# Patient Record
Sex: Male | Born: 1960
Health system: Southern US, Community
[De-identification: ages and names within clinical notes are randomized; demographics above are authoritative.]

## PROBLEM LIST (undated history)

## (undated) DIAGNOSIS — M549 Dorsalgia, unspecified: Secondary | ICD-10-CM

## (undated) DIAGNOSIS — G8929 Other chronic pain: Secondary | ICD-10-CM

## (undated) DIAGNOSIS — F419 Anxiety disorder, unspecified: Secondary | ICD-10-CM

## (undated) DIAGNOSIS — M1990 Unspecified osteoarthritis, unspecified site: Secondary | ICD-10-CM

## (undated) DIAGNOSIS — C61 Malignant neoplasm of prostate: Secondary | ICD-10-CM

## (undated) DIAGNOSIS — R51 Headache: Secondary | ICD-10-CM

## (undated) DIAGNOSIS — I10 Essential (primary) hypertension: Secondary | ICD-10-CM

## (undated) DIAGNOSIS — R519 Headache, unspecified: Secondary | ICD-10-CM

## (undated) HISTORY — PX: COLONOSCOPY: SHX174

## (undated) HISTORY — PX: PROSTATECTOMY: SHX69

---

## 2008-06-12 ENCOUNTER — Emergency Department (HOSPITAL_COMMUNITY): Admission: EM | Admit: 2008-06-12 | Discharge: 2008-06-13 | Payer: Self-pay | Admitting: Emergency Medicine

## 2008-06-13 ENCOUNTER — Ambulatory Visit: Payer: Self-pay | Admitting: Family Medicine

## 2008-07-04 DIAGNOSIS — R55 Syncope and collapse: Secondary | ICD-10-CM | POA: Insufficient documentation

## 2008-07-08 ENCOUNTER — Encounter: Payer: Self-pay | Admitting: Cardiology

## 2008-07-08 ENCOUNTER — Ambulatory Visit: Payer: Self-pay | Admitting: Cardiology

## 2008-07-21 ENCOUNTER — Encounter: Payer: Self-pay | Admitting: Cardiology

## 2008-07-21 ENCOUNTER — Ambulatory Visit: Payer: Self-pay

## 2008-07-21 ENCOUNTER — Ambulatory Visit: Payer: Self-pay | Admitting: Cardiology

## 2008-09-03 ENCOUNTER — Ambulatory Visit: Payer: Self-pay | Admitting: Cardiology

## 2009-08-07 ENCOUNTER — Ambulatory Visit: Payer: Self-pay | Admitting: Family Medicine

## 2009-09-03 LAB — HM COLONOSCOPY

## 2009-09-14 ENCOUNTER — Ambulatory Visit: Payer: Self-pay | Admitting: Family Medicine

## 2009-12-10 ENCOUNTER — Ambulatory Visit: Payer: Self-pay | Admitting: Family Medicine

## 2010-07-08 LAB — POCT I-STAT, CHEM 8
BUN: 19 mg/dL (ref 6–23)
Chloride: 105 mEq/L (ref 96–112)
Creatinine, Ser: 1 mg/dL (ref 0.4–1.5)
Glucose, Bld: 141 mg/dL — ABNORMAL HIGH (ref 70–99)
Hemoglobin: 12.9 g/dL — ABNORMAL LOW (ref 13.0–17.0)
Potassium: 3.5 mEq/L (ref 3.5–5.1)
Sodium: 138 mEq/L (ref 135–145)

## 2010-07-08 LAB — CBC
Hemoglobin: 13.3 g/dL (ref 13.0–17.0)
MCHC: 35.6 g/dL (ref 30.0–36.0)
RBC: 4.3 MIL/uL (ref 4.22–5.81)
WBC: 8.4 10*3/uL (ref 4.0–10.5)

## 2010-07-08 LAB — ETHANOL: Alcohol, Ethyl (B): 24 mg/dL — ABNORMAL HIGH (ref 0–10)

## 2014-06-10 ENCOUNTER — Ambulatory Visit (INDEPENDENT_AMBULATORY_CARE_PROVIDER_SITE_OTHER): Payer: BLUE CROSS/BLUE SHIELD | Admitting: Family Medicine

## 2014-06-10 ENCOUNTER — Encounter: Payer: Self-pay | Admitting: Family Medicine

## 2014-06-10 VITALS — BP 126/80 | HR 86 | Wt 150.0 lb

## 2014-06-10 DIAGNOSIS — M7552 Bursitis of left shoulder: Secondary | ICD-10-CM | POA: Diagnosis not present

## 2014-06-10 NOTE — Progress Notes (Signed)
   Subjective:    Patient ID: Jeff Alexander, male    DOB: December 08, 1960, 54 y.o.   MRN: [id number]  HPI He complains of a two-month history of intermittent left shoulder pain. He notes this especially with abduction and external rotation. No previous history of injury. He notes no pain at night. Popping, locking. He does complain of some left upper trapezius muscle pain. No numbness or tingling. He did try to Advil without much success.   Review of Systems     Objective:   Physical Exam Alert and in no distress. Small trigger point noted in the left mid trapezius. Full motion of the shoulder. No laxity. Negative sulcus sign. No tenderness to palpation. Supraspinatus testing was uncomfortable. Neer's and Hawkins test was negative.       Assessment & Plan:  Shoulder bursitis, left Assistance are most consistent with a bursitis. I discussed this with him and decided to inject. 40 mg of Kenalog and 3 mL of Xylocaine was injected in the subacromial bursa without difficulty after prep with Betadine. He is to let me know how he responds. Recommand heat and stretching for the upper back discomfort.

## 2015-03-12 ENCOUNTER — Encounter: Payer: Self-pay | Admitting: Family Medicine

## 2015-03-12 ENCOUNTER — Other Ambulatory Visit: Payer: Self-pay | Admitting: Family Medicine

## 2015-03-12 ENCOUNTER — Ambulatory Visit (INDEPENDENT_AMBULATORY_CARE_PROVIDER_SITE_OTHER): Payer: BLUE CROSS/BLUE SHIELD | Admitting: Family Medicine

## 2015-03-12 VITALS — BP 128/82 | HR 60 | Wt 152.4 lb

## 2015-03-12 DIAGNOSIS — N63 Unspecified lump in unspecified breast: Secondary | ICD-10-CM

## 2015-03-12 DIAGNOSIS — N50819 Testicular pain, unspecified: Secondary | ICD-10-CM

## 2015-03-12 NOTE — Progress Notes (Signed)
   Subjective:    Patient ID: Jeff Alexander, male    DOB: [birth year], 54 y.o.   MRN: [id number]  HPI For the last 2 weeks he has noted intermittent right testicular discomfort. He notes this especially when he is driving but sitting in other positions, he has no difficulty. He has a previous history of intermittent back pain but none recently. No discharge, frequency, dysuria. His last sexual activity is unknown. He also complains of a 2 day history of feeling a lesion in his right breast. No discharge and some itching but no pain.  medications were reviewed. He is not using any street drugs.   Review of Systems     Objective:   Physical Exam Exam of the right breast does show a 1 cm round smooth nontender nodule at the 2:00 position. Testicular exam shows a left testes to be normal. The right testes has a less than 0.5 cm lesion on the posterior aspect of the testing in the area of them vas deferens. Abdominal exam shows no hepatosplenomegaly , masses or tenderness       Assessment & Plan:  Breast mass in male - Plan: MM Digital Diagnostic Unilat L  Pain of testes  I will also order an ultrasound on his testes to evaluate that.

## 2015-03-17 ENCOUNTER — Ambulatory Visit
Admission: RE | Admit: 2015-03-17 | Discharge: 2015-03-17 | Disposition: A | Discharge status: Home / Self Care | Payer: BLUE CROSS/BLUE SHIELD | Source: Ambulatory Visit | Attending: Family Medicine | Admitting: Family Medicine

## 2015-03-17 DIAGNOSIS — N50819 Testicular pain, unspecified: Secondary | ICD-10-CM

## 2015-03-18 ENCOUNTER — Ambulatory Visit
Admission: RE | Admit: 2015-03-18 | Discharge: 2015-03-18 | Disposition: A | Discharge status: Home / Self Care | Payer: BLUE CROSS/BLUE SHIELD | Source: Ambulatory Visit | Attending: Family Medicine | Admitting: Family Medicine

## 2015-03-18 DIAGNOSIS — N63 Unspecified lump in unspecified breast: Secondary | ICD-10-CM

## 2015-06-07 ENCOUNTER — Emergency Department (HOSPITAL_COMMUNITY)
Admission: EM | Admit: 2015-06-07 | Discharge: 2015-06-07 | Disposition: A | Discharge status: Home / Self Care | Payer: BLUE CROSS/BLUE SHIELD | Attending: Emergency Medicine | Admitting: Emergency Medicine

## 2015-06-07 ENCOUNTER — Emergency Department (HOSPITAL_COMMUNITY): Payer: BLUE CROSS/BLUE SHIELD

## 2015-06-07 ENCOUNTER — Encounter (HOSPITAL_COMMUNITY): Payer: Self-pay | Admitting: Emergency Medicine

## 2015-06-07 DIAGNOSIS — K5732 Diverticulitis of large intestine without perforation or abscess without bleeding: Secondary | ICD-10-CM | POA: Insufficient documentation

## 2015-06-07 DIAGNOSIS — F419 Anxiety disorder, unspecified: Secondary | ICD-10-CM | POA: Insufficient documentation

## 2015-06-07 DIAGNOSIS — R1032 Left lower quadrant pain: Secondary | ICD-10-CM | POA: Diagnosis present

## 2015-06-07 LAB — CBC WITH DIFFERENTIAL/PLATELET
Basophils Absolute: 0 10*3/uL (ref 0.0–0.1)
Basophils Relative: 0 %
Eosinophils Absolute: 0 10*3/uL (ref 0.0–0.7)
Eosinophils Relative: 0 %
HCT: 41.5 % (ref 39.0–52.0)
Hemoglobin: 14.8 g/dL (ref 13.0–17.0)
Lymphocytes Relative: 9 %
Lymphs Abs: 1.4 10*3/uL (ref 0.7–4.0)
MCH: 29.9 pg (ref 26.0–34.0)
MCHC: 35.7 g/dL (ref 30.0–36.0)
MCV: 83.8 fL (ref 78.0–100.0)
Monocytes Absolute: 0.5 10*3/uL (ref 0.1–1.0)
Monocytes Relative: 3 %
Neutro Abs: 14.4 10*3/uL — ABNORMAL HIGH (ref 1.7–7.7)
Neutrophils Relative %: 88 %
Platelets: 240 10*3/uL (ref 150–400)
RBC: 4.95 MIL/uL (ref 4.22–5.81)
RDW: 12.8 % (ref 11.5–15.5)
WBC: 16.4 10*3/uL — ABNORMAL HIGH (ref 4.0–10.5)

## 2015-06-07 LAB — COMPREHENSIVE METABOLIC PANEL
ALT: 18 U/L (ref 17–63)
AST: 16 U/L (ref 15–41)
Albumin: 4.6 g/dL (ref 3.5–5.0)
Alkaline Phosphatase: 67 U/L (ref 38–126)
Anion gap: 9 (ref 5–15)
BUN: 12 mg/dL (ref 6–20)
CO2: 26 mmol/L (ref 22–32)
Calcium: 9.6 mg/dL (ref 8.9–10.3)
Chloride: 102 mmol/L (ref 101–111)
Creatinine, Ser: 1.03 mg/dL (ref 0.61–1.24)
GFR calc Af Amer: >60 mL/min (ref >60)
GFR calc non Af Amer: >60 mL/min (ref >60)
Glucose, Bld: 122 mg/dL — ABNORMAL HIGH (ref 65–99)
Potassium: 4.5 mmol/L (ref 3.5–5.1)
Sodium: 137 mmol/L (ref 135–145)
Total Bilirubin: 1 mg/dL (ref 0.3–1.2)
Total Protein: 7.4 g/dL (ref 6.5–8.1)

## 2015-06-07 LAB — URINALYSIS, ROUTINE W REFLEX MICROSCOPIC
Bilirubin Urine: NEGATIVE
Glucose, UA: NEGATIVE mg/dL
HGB URINE DIPSTICK: NEGATIVE
Ketones, ur: NEGATIVE mg/dL
Leukocytes, UA: NEGATIVE
NITRITE: NEGATIVE
PH: 6 (ref 5.0–8.0)
Protein, ur: NEGATIVE mg/dL
SPECIFIC GRAVITY, URINE: 1.003 — AB (ref 1.005–1.030)

## 2015-06-07 MED ORDER — ONDANSETRON HCL 4 MG/2ML IJ SOLN
4.0000 mg | Freq: Once | INTRAMUSCULAR | Status: AC
  Administered 2015-06-07: 4 mg via INTRAVENOUS
  Filled 2015-06-07: qty 2

## 2015-06-07 MED ORDER — IOHEXOL 300 MG/ML  SOLN
100.0000 mL | Freq: Once | INTRAMUSCULAR | Status: AC | PRN
  Administered 2015-06-07: 100 mL via INTRAVENOUS

## 2015-06-07 MED ORDER — METRONIDAZOLE 500 MG PO TABS: 500.0000 mg | ORAL_TABLET | Freq: Two times a day (BID) | ORAL | Status: DC

## 2015-06-07 MED ORDER — CIPROFLOXACIN HCL 500 MG PO TABS: 500.0000 mg | ORAL_TABLET | Freq: Two times a day (BID) | ORAL | Status: DC

## 2015-06-07 MED ORDER — HYDROCODONE-ACETAMINOPHEN 5-325 MG PO TABS: 1.0000 | ORAL_TABLET | Freq: Four times a day (QID) | ORAL | Status: DC | PRN

## 2015-06-07 MED ORDER — SODIUM CHLORIDE 0.9 % IV BOLUS (SEPSIS)
1000.0000 mL | Freq: Once | INTRAVENOUS | Status: AC
Start: 2015-06-07 — End: 2015-06-07
  Administered 2015-06-07: 1000 mL via INTRAVENOUS

## 2015-06-07 MED ORDER — IOHEXOL 300 MG/ML  SOLN
25.0000 mL | Freq: Once | INTRAMUSCULAR | Status: AC | PRN
  Administered 2015-06-07: 25 mL via ORAL

## 2015-06-07 NOTE — Discharge Instructions (Signed)
Diverticulitis Diverticulitis is inflammation or infection of small pouches in your colon that form when you have a condition called diverticulosis. The pouches in your colon are called diverticula. Your colon, or large intestine, is where water is absorbed and stool is formed. Complications of diverticulitis can include:  Bleeding.  Severe infection.  Severe pain.  Perforation of your colon.  Obstruction of your colon. CAUSES  Diverticulitis is caused by bacteria. Diverticulitis happens when stool becomes trapped in diverticula. This allows bacteria to grow in the diverticula, which can lead to inflammation and infection. RISK FACTORS People with diverticulosis are at risk for diverticulitis. Eating a diet that does not include enough fiber from fruits and vegetables may make diverticulitis more likely to develop. SYMPTOMS  Symptoms of diverticulitis may include:  Abdominal pain and tenderness. The pain is normally located on the left side of the abdomen, but may occur in other areas.  Fever and chills.  Bloating.  Cramping.  Nausea.  Vomiting.  Constipation.  Diarrhea.  Blood in your stool. DIAGNOSIS  Your health care provider will ask you about your medical history and do a physical exam. You may need to have tests done because many medical conditions can cause the same symptoms as diverticulitis. Tests may include:  Blood tests.  Urine tests.  Imaging tests of the abdomen, including X-rays and CT scans. When your condition is under control, your health care provider may recommend that you have a colonoscopy. A colonoscopy can show how severe your diverticula are and whether something else is causing your symptoms. TREATMENT  Most cases of diverticulitis are mild and can be treated at home. Treatment may include:  Taking over-the-counter pain medicines.  Following a clear liquid diet.  Taking antibiotic medicines by mouth for 7-10 days. More severe cases may  be treated at a hospital. Treatment may include:  Not eating or drinking.  Taking prescription pain medicine.  Receiving antibiotic medicines through an IV tube.  Receiving fluids and nutrition through an IV tube.  Surgery. HOME CARE INSTRUCTIONS   Follow your health care provider's instructions carefully.  Follow a full liquid diet or other diet as directed by your health care provider. After your symptoms improve, your health care provider may tell you to change your diet. He or she may recommend you eat a high-fiber diet. Fruits and vegetables are good sources of fiber. Fiber makes it easier to pass stool.  Take fiber supplements or probiotics as directed by your health care provider.  Only take medicines as directed by your health care provider.  Keep all your follow-up appointments. SEEK MEDICAL CARE IF:   Your pain does not improve.  You have a hard time eating food.  Your bowel movements do not return to normal. SEEK IMMEDIATE MEDICAL CARE IF:   Your pain becomes worse.  Your symptoms do not get better.  Your symptoms suddenly get worse.  You have a fever.  You have repeated vomiting.  You have bloody or black, tarry stools. MAKE SURE YOU:   Understand these instructions.  Will watch your condition.  Will get help right away if you are not doing well or get worse.   This information is not intended to replace advice given to you by your health care provider. Make sure you discuss any questions you have with your health care provider.   Document Released: 12/22/2004 Document Revised: 03/19/2013 Document Reviewed: 02/06/2013 Elsevier Interactive Patient Education 2016 Elsevier Inc.  High-Fiber Diet Fiber, also called dietary fiber, is  a type of carbohydrate found in fruits, vegetables, whole grains, and beans. A high-fiber diet can have many health benefits. Your health care provider may recommend a high-fiber diet to help:  Prevent constipation. Fiber  can make your bowel movements more regular.  Lower your cholesterol.  Relieve hemorrhoids, uncomplicated diverticulosis, or irritable bowel syndrome.  Prevent overeating as part of a weight-loss plan.  Prevent heart disease, type 2 diabetes, and certain cancers. WHAT IS MY PLAN? The recommended daily intake of fiber includes:  38 grams for men under age 55.  48 grams for men over age 73.  36 grams for women under age 44.  84 grams for women over age 20. You can get the recommended daily intake of dietary fiber by eating a variety of fruits, vegetables, grains, and beans. Your health care provider may also recommend a fiber supplement if it is not possible to get enough fiber through your diet. WHAT DO I NEED TO KNOW ABOUT A HIGH-FIBER DIET?  Fiber supplements have not been widely studied for their effectiveness, so it is better to get fiber through food sources.  Always check the fiber content on thenutrition facts label of any prepackaged food. Look for foods that contain at least 5 grams of fiber per serving.  Ask your dietitian if you have questions about specific foods that are related to your condition, especially if those foods are not listed in the following section.  Increase your daily fiber consumption gradually. Increasing your intake of dietary fiber too quickly may cause bloating, cramping, or gas.  Drink plenty of water. Water helps you to digest fiber. WHAT FOODS CAN I EAT? Grains Whole-grain breads. Multigrain cereal. Oats and oatmeal. Brown rice. Barley. Bulgur wheat. [place]. Bran muffins. Popcorn. Rye wafer crackers. Vegetables Sweet potatoes. Spinach. Kale. Artichokes. Cabbage. Broccoli. Green peas. Carrots. Squash. Fruits Berries. Pears. Apples. Oranges. Avocados. Prunes and raisins. Dried figs. Meats and Other Protein Sources [place], kidney, pinto, and soy beans. Split peas. Lentils. Nuts and seeds. Dairy Fiber-fortified yogurt. Beverages Fiber-fortified  soy milk. Fiber-fortified orange juice. Other Fiber bars. The items listed above may not be a complete list of recommended foods or beverages. Contact your dietitian for more options. WHAT FOODS ARE NOT RECOMMENDED? Grains White bread. Pasta made with refined flour. White rice. Vegetables Fried potatoes. Canned vegetables. Well-cooked vegetables.  Fruits Fruit juice. Cooked, strained fruit. Meats and Other Protein Sources Fatty cuts of meat. Fried Sales executive or fried fish. Dairy Milk. Yogurt. Cream cheese. Sour cream. Beverages Soft drinks. Other Cakes and pastries. Butter and oils. The items listed above may not be a complete list of foods and beverages to avoid. Contact your dietitian for more information. WHAT ARE SOME TIPS FOR INCLUDING HIGH-FIBER FOODS IN MY DIET?  Eat a wide variety of high-fiber foods.  Make sure that half of all grains consumed each day are whole grains.  Replace breads and cereals made from refined flour or white flour with whole-grain breads and cereals.  Replace white rice with brown rice, bulgur wheat, or millet.  Start the day with a breakfast that is high in fiber, such as a cereal that contains at least 5 grams of fiber per serving.  Use beans in place of meat in soups, salads, or pasta.  Eat high-fiber snacks, such as berries, raw vegetables, nuts, or popcorn.   This information is not intended to replace advice given to you by your health care provider. Make sure you discuss any questions you have with your health  care provider.   Document Released: 03/14/2005 Document Revised: 04/04/2014 Document Reviewed: 08/27/2013 Elsevier Interactive Patient Education [employer].

## 2015-06-07 NOTE — ED Provider Notes (Signed)
CSN: [id number]     Arrival date & time 06/07/15  1647 History   First MD Initiated Contact with Patient 06/07/15 1652     Chief Complaint  Patient presents with  . Abdominal Pain    Jeff Alexander is a 55 y.o. male who presents to the ED complaining of LLQ abdominal pain starting today with associated loose stool today. The patient currently complains of 5 out of 10 left lower quadrant abdominal pain that is worse with palpation. He reports one loose stool today. He denies any nausea or vomiting. He denies any hematemesis or hematochezia. He denies previous abdominal surgeries. He reports decreased appetite today. He denies fevers, nausea, vomiting, chest pain, shortness of breath, significant, urinary symptoms, masses, lumps, or hernias.  HPI  History reviewed. No pertinent past medical history. History reviewed. No pertinent past surgical history. No family history on file. Social History  Substance Use Topics  . Smoking status: Never Smoker   . Smokeless tobacco: Never Used  . Alcohol Use: No    Review of Systems  Constitutional: Negative for fever and chills.  HENT: Negative for congestion and sore throat.   Eyes: Negative for visual disturbance.  Respiratory: Negative for cough and shortness of breath.   Cardiovascular: Negative for chest pain.  Gastrointestinal: Positive for abdominal pain and diarrhea. Negative for nausea, vomiting and blood in stool.  Genitourinary: Negative for dysuria, urgency, hematuria and difficulty urinating.  Musculoskeletal: Negative for back pain and neck pain.  Skin: Negative for rash.  Neurological: Negative for syncope and headaches.      Allergies  Codeine  Home Medications   Prior to Admission medications   Not on File   BP 159/103 mmHg  Pulse 104  Temp(Src) 98.3 F (36.8 C) (Oral)  Resp 16  SpO2 98% Physical Exam  Constitutional: He appears well-developed and well-nourished. No distress.  Nontoxic appearing.  HENT:  Head:  Normocephalic and atraumatic.  Mouth/Throat: Oropharynx is clear and moist.  Eyes: Conjunctivae are normal. Pupils are equal, round, and reactive to light. Right eye exhibits no discharge. Left eye exhibits no discharge.  Neck: Neck supple.  Cardiovascular: Normal rate, regular rhythm, normal heart sounds and intact distal pulses.  Exam reveals no gallop and no friction rub.   No murmur heard. HR is 88.  Pulmonary/Chest: Effort normal and breath sounds normal. No respiratory distress. He has no wheezes. He has no rales.  Abdominal: Soft. Bowel sounds are normal. He exhibits no distension and no mass. There is tenderness. There is guarding. There is no rebound.  Abdomen is soft. Bowel sounds are present. Patient has moderate left lower quadrant tenderness to palpation with guarding. No right lower quadrant tenderness. No psoas or obturator sign. No CVA or flank tenderness. No masses or lesions palpated. No hernias palpated.  Musculoskeletal: He exhibits no edema.  Lymphadenopathy:    He has no cervical adenopathy.  Neurological: He is alert. Coordination normal.  Skin: Skin is warm and dry. No rash noted. He is not diaphoretic. No erythema. No pallor.  Psychiatric: His behavior is normal. His mood appears anxious.  Patient appears very anxious.   Nursing note and vitals reviewed.   ED Course  Procedures (including critical care time) Labs Review Labs Reviewed  COMPREHENSIVE METABOLIC PANEL - Abnormal; Notable for the following:    Glucose, Bld 122 (*)    All other components within normal limits  CBC WITH DIFFERENTIAL/PLATELET - Abnormal; Notable for the following:    WBC 16.4 (*)  Neutro Abs 14.4 (*)    All other components within normal limits  URINALYSIS, ROUTINE W REFLEX MICROSCOPIC (NOT AT [facility]) - Abnormal; Notable for the following:    Specific Gravity, Urine 1.003 (*)    All other components within normal limits    Imaging Review Ct Abdomen Pelvis W Contrast  06/07/2015   CLINICAL DATA:  Acute onset of left lower quadrant abdominal pain. Initial encounter. EXAM: CT ABDOMEN AND PELVIS WITH CONTRAST TECHNIQUE: Multidetector CT imaging of the abdomen and pelvis was performed using the standard protocol following bolus administration of intravenous contrast. CONTRAST:  140mL OMNIPAQUE IOHEXOL 300 MG/ML SOLN, 29mL OMNIPAQUE IOHEXOL 300 MG/ML SOLN COMPARISON:  Scrotal ultrasound performed 03/17/2015 FINDINGS: Minimal right basilar atelectasis is noted. The liver and spleen are unremarkable in appearance. The gallbladder is within normal limits. The pancreas and right adrenal gland are unremarkable. A 1.2 cm hypodensity within the left adrenal gland is not fully characterized on this study. The kidneys are unremarkable in appearance. There is no evidence of hydronephrosis. No renal or ureteral stones are seen. No perinephric stranding is appreciated. The small bowel is unremarkable in appearance. The stomach is within normal limits. No acute vascular abnormalities are seen. The appendix is normal in caliber, without evidence of appendicitis. The colon is largely decompressed. Scattered diverticulosis is noted along the descending and proximal sigmoid colon. Focal soft tissue inflammation is noted about the proximal sigmoid colon, with associated wall thickening and free fluid tracking inferiorly anterior to the left iliopsoas. The fluid is slightly complex, without definite evidence of abscess formation at this time. Findings are compatible with acute diverticulitis. No free air is seen. The bladder is moderately distended and grossly unremarkable. The prostate remains normal in size, with some degree of prominence of the vasculature about the left side of the prostate. Would correlate with PSA. No inguinal lymphadenopathy is seen. No acute osseous abnormalities are identified. IMPRESSION: 1. Acute diverticulitis at the proximal sigmoid colon, with focal soft tissue inflammation, wall  thickening and free fluid tracking inferiorly anterior to the left iliopsoas. The fluid is slightly complex, without definite evidence of abscess formation at this time. No free air seen. 2. Scattered diverticulosis along the descending and proximal sigmoid colon. 3. Prostate remains normal in size, though the vasculature about the left side of the prostate is prominent. Would correlate with PSA. 4. 1.2 cm left adrenal nodule is not fully characterized on this study. Would correlate with labs. Adrenal protocol MRI or CT could be considered for further evaluation. Electronically Signed   By: Garald Balding M.D.   On: 06/07/2015 18:49   I have personally reviewed and evaluated these images and lab results as part of my medical decision-making.   EKG Interpretation None     Filed Vitals:   06/07/15 1655  BP: 159/103  Pulse: 104  Temp: 98.3 F (36.8 C)  TempSrc: Oral  Resp: 16  SpO2: 98%    MDM   Meds given in ED:  Medications  sodium chloride 0.9 % bolus 1,000 mL (1,000 mLs Intravenous New Bag/Given 06/07/15 1814)  ondansetron (ZOFRAN) injection 4 mg (4 mg Intravenous Given 06/07/15 1814)  iohexol (OMNIPAQUE) 300 MG/ML solution 25 mL (25 mLs Oral Contrast Given 06/07/15 1835)  iohexol (OMNIPAQUE) 300 MG/ML solution 100 mL (100 mLs Intravenous Contrast Given 06/07/15 1835)    New Prescriptions   CIPROFLOXACIN (CIPRO) 500 MG TABLET    Take 1 tablet (500 mg total) by mouth 2 (two) times daily.  HYDROCODONE-ACETAMINOPHEN (NORCO) 5-325 MG TABLET    Take 1 tablet by mouth every 6 (six) hours as needed for moderate pain.   METRONIDAZOLE (FLAGYL) 500 MG TABLET    Take 1 tablet (500 mg total) by mouth 2 (two) times daily.    Final diagnoses:  Diverticulitis of large intestine without perforation or abscess without bleeding   This  is a 55 y.o. male who presents to the ED complaining of LLQ abdominal pain starting today with associated loose stool today. The patient currently complains of 5 out  of 10 left lower quadrant abdominal pain that is worse with palpation. He reports one loose stool today. He denies any nausea or vomiting. He denies any hematemesis or hematochezia. He denies previous abdominal surgeries. No history of diverticulitis. On exam the patient is afebrile nontoxic appearing. His abdomen is soft and has moderate left lower quadrant tenderness to palpation. CMP is unremarkable. CBC reveals a leukocytosis with a white count of 16,000. Urinalysis is unremarkable. CT abdomen and pelvis with contrast indicated acute diverticulitis at the proximal sigmoid colon, with focal soft tissue inflammation, wall thickening and free fluid. No definite evidence of abscess formation. No free air.  On reevaluation I discussed these findings with the patient. He is tolerating by mouth. He reports his pain is around a 2 out of 10 and he does not wish for any pain medicine. He has had no pain medication here. He did receive a liter fluid bolus. He does still appear slightly anxious. Will start the patient on Cipro and Flagyl and provide with Norco for breakthrough pain. I discussed the expected treatment and course of diverticulitis. I advised needs to follow-up with primary care and gastroenterology this week. I discussed strict and specific return precautions. I advised the patient to follow-up with their primary care provider this week. I advised the patient to return to the emergency department with new or worsening symptoms or new concerns. The patient verbalized understanding and agreement with plan.    This patient was discussed with Dr. Johnney Killian who agrees with assessment and plan.   Waynetta Pean, PA-C 06/07/15 2041  Charlesetta Shanks, MD 06/10/15 1535

## 2015-06-07 NOTE — ED Notes (Signed)
Per pt, states left lower abdominal pain since this am-PCP told him to come to ED for eval

## 2015-06-16 ENCOUNTER — Ambulatory Visit (INDEPENDENT_AMBULATORY_CARE_PROVIDER_SITE_OTHER): Payer: BLUE CROSS/BLUE SHIELD | Admitting: Family Medicine

## 2015-06-16 ENCOUNTER — Encounter: Payer: Self-pay | Admitting: Family Medicine

## 2015-06-16 VITALS — BP 128/82 | HR 94 | Resp 14 | Ht 67.0 in | Wt 151.8 lb

## 2015-06-16 DIAGNOSIS — K5732 Diverticulitis of large intestine without perforation or abscess without bleeding: Secondary | ICD-10-CM

## 2015-06-16 DIAGNOSIS — L739 Follicular disorder, unspecified: Secondary | ICD-10-CM

## 2015-06-16 MED ORDER — CIPROFLOXACIN HCL 500 MG PO TABS: 500.0000 mg | ORAL_TABLET | Freq: Two times a day (BID) | ORAL | Status: DC

## 2015-06-16 MED ORDER — HYDROCODONE-ACETAMINOPHEN 5-325 MG PO TABS: 1.0000 | ORAL_TABLET | Freq: Four times a day (QID) | ORAL | Status: DC | PRN

## 2015-06-16 MED ORDER — METRONIDAZOLE 500 MG PO TABS: 500.0000 mg | ORAL_TABLET | Freq: Three times a day (TID) | ORAL | Status: DC

## 2015-06-16 NOTE — Patient Instructions (Signed)
Use Hibiclens. If your symptoms worsen call me

## 2015-06-16 NOTE — Progress Notes (Signed)
   Subjective:    Patient ID: Jeff Alexander, male    DOB: 12/02/1960, 55 y.o.   MRN: [id number]  HPI He is here for follow-up visit after recent ER visit approximately 10 days ago for evaluation and treatment of diverticulitis. He was placed on Flagyl and Cipro as well as given codeine. He did improve within the last day or so he has noted a slight increase in the discomfort in the left lower quadrant. His bowel habits are normal. No fever, chills, nausea vomiting or diarrhea.He did have a colonoscopy in 2012. Within the last day or so his also developed a rash present mainly on his chest and to a lesser extent on his back. It is not pruritic.   Review of Systems     Objective:   Physical Exam Alert and in no distress. A maculopapular type rash is present on his anterior chest and to a lesser extent on his back. It is almost like an acne type rash. Abdominal exam shows decreased bowel sounds with slight lower abdominal tenderness but no rebound. The emergency room record lab and x-rays were reviewed.      Assessment & Plan:  Folliculitis  Diverticulitis of colon without hemorrhage - Plan: metroNIDAZOLE (FLAGYL) 500 MG tablet, ciprofloxacin (CIPRO) 500 MG tablet, HYDROcodone-acetaminophen (NORCO) 5-325 MG tablet when he used Hibiclens for the skin as I do not think this is related to his medications. I will continue him on Cipro and did get Flagyl at 500 3 times a day. If his symptoms worsen, I told him to call me this we might need to reevaluate him with a CT.

## 2015-06-16 NOTE — Addendum Note (Signed)
Addended by: Kyra Manges on: 06/16/2015 05:09 PM   Modules accepted: Orders

## 2015-06-17 ENCOUNTER — Ambulatory Visit
Admission: RE | Admit: 2015-06-17 | Discharge: 2015-06-17 | Disposition: A | Discharge status: Home / Self Care | Payer: No Typology Code available for payment source | Source: Ambulatory Visit | Attending: Family Medicine | Admitting: Family Medicine

## 2015-06-17 ENCOUNTER — Encounter: Payer: Self-pay | Admitting: Family Medicine

## 2015-06-17 ENCOUNTER — Telehealth: Payer: Self-pay | Admitting: Family Medicine

## 2015-06-17 DIAGNOSIS — K5792 Diverticulitis of intestine, part unspecified, without perforation or abscess without bleeding: Secondary | ICD-10-CM

## 2015-06-17 MED ORDER — IOPAMIDOL (ISOVUE-300) INJECTION 61%
100.0000 mL | Freq: Once | INTRAVENOUS | Status: AC | PRN
  Administered 2015-06-17: 100 mL via INTRAVENOUS

## 2015-06-17 NOTE — Telephone Encounter (Signed)
Pt called back and states that his pain is no better, when he lays down it gets worse, was told to call you back today if he was not feeling any better, he is just not feeling any better, pt can be reached at [phone] (M)

## 2015-06-17 NOTE — Telephone Encounter (Signed)
Set him up for CT scan of abdomen and pelvis with IV and oral contrast ASAP. Diagnosis is unresolved diverticulitis with slight worsening of symptoms

## 2015-06-17 NOTE — Telephone Encounter (Signed)
CT is scheduled for today.

## 2015-06-17 NOTE — Progress Notes (Signed)
   Subjective:    Patient ID: Jeff Alexander, male    DOB: 01-13-1961, 55 y.o.   MRN: [id number]  HPI    Review of Systems     Objective:   Physical Exam        Assessment & Plan:  His case was discussed with Dr. Ardis Hughs. I will order a CT of abdomen with IV and oral contrast and follow-up after the results are back.

## 2015-06-18 NOTE — Progress Notes (Signed)
   Subjective:    Patient ID: Jeff Alexander, male    DOB: [birth year], 55 y.o.   MRN: [id number]  HPI Comments: He is feeling better. I gave him information concerning the CT scan. He is to call me when he finishes this round of antibiotic and if he is not back to normal, I will give him more     Review of Systems     Objective:   Physical Exam        Assessment & Plan:

## 2017-07-11 ENCOUNTER — Ambulatory Visit: Payer: BLUE CROSS/BLUE SHIELD | Admitting: Family Medicine

## 2017-07-11 ENCOUNTER — Encounter: Payer: Self-pay | Admitting: Family Medicine

## 2017-07-11 VITALS — BP 138/86 | HR 77 | Temp 97.9°F | Ht 66.0 in | Wt 150.4 lb

## 2017-07-11 DIAGNOSIS — N410 Acute prostatitis: Secondary | ICD-10-CM | POA: Diagnosis not present

## 2017-07-11 DIAGNOSIS — N401 Enlarged prostate with lower urinary tract symptoms: Secondary | ICD-10-CM

## 2017-07-11 DIAGNOSIS — Z209 Contact with and (suspected) exposure to unspecified communicable disease: Secondary | ICD-10-CM | POA: Diagnosis not present

## 2017-07-11 DIAGNOSIS — R350 Frequency of micturition: Secondary | ICD-10-CM

## 2017-07-11 DIAGNOSIS — Z1159 Encounter for screening for other viral diseases: Secondary | ICD-10-CM | POA: Diagnosis not present

## 2017-07-11 DIAGNOSIS — Z23 Encounter for immunization: Secondary | ICD-10-CM

## 2017-07-11 LAB — POCT URINALYSIS DIP (PROADVANTAGE DEVICE)
BILIRUBIN UA: NEGATIVE mg/dL
Bilirubin, UA: NEGATIVE
Blood, UA: NEGATIVE
Glucose, UA: NEGATIVE mg/dL
LEUKOCYTES UA: NEGATIVE
Nitrite, UA: NEGATIVE
PROTEIN UA: NEGATIVE mg/dL
Specific Gravity, Urine: 1.01
Urobilinogen, Ur: 3.5
pH, UA: 6 (ref 5.0–8.0)

## 2017-07-11 MED ORDER — CIPROFLOXACIN HCL 500 MG PO TABS: 500.0000 mg | ORAL_TABLET | Freq: Two times a day (BID) | ORAL | 0 refills | Status: DC

## 2017-07-11 NOTE — Progress Notes (Signed)
   Subjective:    Patient ID: Jeff Alexander, male    DOB: Sep 11, 1960, 57 y.o.   MRN: [id number]  HPI He complains of a one-month history of decreased urinary stream, frequency and urgency but no dysuria, discharge, abdominal pain, fever or chills.  Last sexual activity was over 6 months ago. His chart was reviewed.  Review of Systems     Objective:   Physical Exam Alert and in no distress.  Abdominal exam shows no masses or tenderness.  Genitalia shows normal circumcised male with normal penis and testes.  Rectal exam did show an enlarged prostate with the left pole being slightly more prominent however it was not boggy or tender to palpation.       Assessment & Plan:  Acute prostatitis - Plan: ciprofloxacin (CIPRO) 500 MG tablet  Contact with or exposure to communicable disease - Plan: GC/Chlamydia Probe Amp, HIV antibody, RPR, POCT Urinalysis DIP (Proadvantage Device)  Need for Tdap vaccination - Plan: Tdap vaccine greater than or equal to 7yo IM  Need for hepatitis C screening test - Plan: Hepatitis C antibody  Benign prostatic hyperplasia with urinary frequency  I will make sure that this is not an STD.  Also his immunizations were updated.  I explained the fact that this could easily be an inflammatory process rather than infectious.  If he does not respond to the medication will probably refer to urology.  His prostate for someone his age is larger than I would expect.

## 2017-07-12 LAB — RPR: RPR: NONREACTIVE

## 2017-07-12 LAB — GC/CHLAMYDIA PROBE AMP
Chlamydia trachomatis, NAA: NEGATIVE
Neisseria gonorrhoeae by PCR: NEGATIVE

## 2017-07-12 LAB — HIV ANTIBODY (ROUTINE TESTING W REFLEX): HIV Screen 4th Generation wRfx: NONREACTIVE

## 2017-07-12 LAB — HEPATITIS C ANTIBODY: Hep C Virus Ab: <0.1 s/co ratio (ref 0.0–0.9)

## 2017-07-17 ENCOUNTER — Encounter: Payer: Self-pay | Admitting: Family Medicine

## 2017-07-17 ENCOUNTER — Other Ambulatory Visit: Payer: Self-pay | Admitting: Family Medicine

## 2017-07-17 DIAGNOSIS — N410 Acute prostatitis: Secondary | ICD-10-CM

## 2017-07-17 MED ORDER — CIPROFLOXACIN HCL 500 MG PO TABS: 500.0000 mg | ORAL_TABLET | Freq: Two times a day (BID) | ORAL | 0 refills | Status: DC

## 2017-07-27 ENCOUNTER — Encounter: Payer: Self-pay | Admitting: Family Medicine

## 2017-07-27 MED ORDER — SULFAMETHOXAZOLE-TRIMETHOPRIM 800-160 MG PO TABS: 1.0000 | ORAL_TABLET | Freq: Two times a day (BID) | ORAL | 0 refills | Status: DC

## 2017-08-10 ENCOUNTER — Encounter: Payer: Self-pay | Admitting: Family Medicine

## 2017-08-10 MED ORDER — SULFAMETHOXAZOLE-TRIMETHOPRIM 800-160 MG PO TABS: 1.0000 | ORAL_TABLET | Freq: Two times a day (BID) | ORAL | 0 refills | Status: DC

## 2017-09-13 ENCOUNTER — Encounter: Payer: Self-pay | Admitting: Family Medicine

## 2017-09-15 ENCOUNTER — Encounter: Payer: Self-pay | Admitting: Family Medicine

## 2017-09-15 ENCOUNTER — Other Ambulatory Visit: Payer: Self-pay

## 2017-09-15 ENCOUNTER — Other Ambulatory Visit: Payer: Self-pay | Admitting: Family Medicine

## 2017-09-15 DIAGNOSIS — R350 Frequency of micturition: Principal | ICD-10-CM

## 2017-09-15 DIAGNOSIS — N401 Enlarged prostate with lower urinary tract symptoms: Secondary | ICD-10-CM

## 2017-09-15 MED ORDER — DOXYCYCLINE HYCLATE 100 MG PO TABS: 100.0000 mg | ORAL_TABLET | Freq: Two times a day (BID) | ORAL | 0 refills | Status: DC

## 2017-09-29 ENCOUNTER — Encounter: Payer: Self-pay | Admitting: Family Medicine

## 2017-09-29 MED ORDER — DOXYCYCLINE HYCLATE 100 MG PO TABS: 100.0000 mg | ORAL_TABLET | Freq: Two times a day (BID) | ORAL | 0 refills | Status: DC

## 2017-10-13 DIAGNOSIS — R3915 Urgency of urination: Secondary | ICD-10-CM | POA: Diagnosis not present

## 2017-10-13 DIAGNOSIS — N403 Nodular prostate with lower urinary tract symptoms: Secondary | ICD-10-CM | POA: Diagnosis not present

## 2017-10-13 DIAGNOSIS — R350 Frequency of micturition: Secondary | ICD-10-CM | POA: Diagnosis not present

## 2017-11-16 DIAGNOSIS — N403 Nodular prostate with lower urinary tract symptoms: Secondary | ICD-10-CM | POA: Diagnosis not present

## 2017-11-24 DIAGNOSIS — R350 Frequency of micturition: Secondary | ICD-10-CM | POA: Diagnosis not present

## 2017-11-24 DIAGNOSIS — N403 Nodular prostate with lower urinary tract symptoms: Secondary | ICD-10-CM | POA: Diagnosis not present

## 2017-11-24 DIAGNOSIS — R3915 Urgency of urination: Secondary | ICD-10-CM | POA: Diagnosis not present

## 2017-11-24 DIAGNOSIS — R972 Elevated prostate specific antigen [PSA]: Secondary | ICD-10-CM | POA: Diagnosis not present

## 2017-11-28 DIAGNOSIS — R972 Elevated prostate specific antigen [PSA]: Secondary | ICD-10-CM | POA: Diagnosis not present

## 2017-12-01 ENCOUNTER — Other Ambulatory Visit: Payer: Self-pay | Admitting: Urology

## 2017-12-01 DIAGNOSIS — N403 Nodular prostate with lower urinary tract symptoms: Principal | ICD-10-CM

## 2017-12-01 DIAGNOSIS — N138 Other obstructive and reflux uropathy: Secondary | ICD-10-CM

## 2017-12-01 DIAGNOSIS — C61 Malignant neoplasm of prostate: Secondary | ICD-10-CM

## 2017-12-04 DIAGNOSIS — C61 Malignant neoplasm of prostate: Secondary | ICD-10-CM | POA: Diagnosis not present

## 2017-12-05 DIAGNOSIS — C61 Malignant neoplasm of prostate: Secondary | ICD-10-CM | POA: Diagnosis not present

## 2017-12-08 ENCOUNTER — Telehealth: Payer: Self-pay | Admitting: Medical Oncology

## 2017-12-08 NOTE — Telephone Encounter (Signed)
I called pt to introduce myself as the Prostate Nurse Navigator and the Coordinator of the Prostate [place].  1. I confirmed with the patient he is aware of his referral to the clinic 12/19/17 arriving at 12:30 pm.  2. I discussed the format of the clinic and the physicians he will be seeing that day.  3. I discussed where the clinic is located and how to contact me.  4. I confirmed his address and informed him I would be mailing a packet of information and forms to be completed. I asked him to bring them with him the day of his appointment.   He voiced understanding of the above. I asked him to call me if he has any questions or concerns regarding his appointments or the forms he needs to complete.

## 2017-12-11 ENCOUNTER — Ambulatory Visit (HOSPITAL_COMMUNITY)
Admission: RE | Admit: 2017-12-11 | Discharge: 2017-12-11 | Disposition: A | Discharge status: Home / Self Care | Payer: BLUE CROSS/BLUE SHIELD | Source: Ambulatory Visit | Attending: Urology | Admitting: Urology

## 2017-12-11 ENCOUNTER — Encounter (HOSPITAL_COMMUNITY)
Admission: RE | Admit: 2017-12-11 | Discharge: 2017-12-11 | Disposition: A | Discharge status: Home / Self Care | Payer: BLUE CROSS/BLUE SHIELD | Source: Ambulatory Visit | Attending: Urology | Admitting: Urology

## 2017-12-11 DIAGNOSIS — N138 Other obstructive and reflux uropathy: Secondary | ICD-10-CM

## 2017-12-11 DIAGNOSIS — C61 Malignant neoplasm of prostate: Secondary | ICD-10-CM

## 2017-12-11 DIAGNOSIS — N403 Nodular prostate with lower urinary tract symptoms: Secondary | ICD-10-CM

## 2017-12-11 MED ORDER — TECHNETIUM TC 99M MEDRONATE IV KIT
20.9000 | PACK | Freq: Once | INTRAVENOUS | Status: AC | PRN
  Administered 2017-12-11: 20.9 via INTRAVENOUS

## 2017-12-13 ENCOUNTER — Other Ambulatory Visit: Payer: Self-pay | Admitting: Urology

## 2017-12-13 ENCOUNTER — Ambulatory Visit
Admission: RE | Admit: 2017-12-13 | Discharge: 2017-12-13 | Disposition: A | Discharge status: Home / Self Care | Payer: BLUE CROSS/BLUE SHIELD | Source: Ambulatory Visit | Attending: Urology | Admitting: Urology

## 2017-12-13 DIAGNOSIS — C61 Malignant neoplasm of prostate: Secondary | ICD-10-CM

## 2017-12-13 DIAGNOSIS — M4184 Other forms of scoliosis, thoracic region: Secondary | ICD-10-CM | POA: Diagnosis not present

## 2017-12-13 DIAGNOSIS — M47814 Spondylosis without myelopathy or radiculopathy, thoracic region: Secondary | ICD-10-CM | POA: Diagnosis not present

## 2017-12-13 DIAGNOSIS — M4186 Other forms of scoliosis, lumbar region: Secondary | ICD-10-CM | POA: Diagnosis not present

## 2017-12-13 DIAGNOSIS — M5136 Other intervertebral disc degeneration, lumbar region: Secondary | ICD-10-CM | POA: Diagnosis not present

## 2017-12-13 DIAGNOSIS — M47812 Spondylosis without myelopathy or radiculopathy, cervical region: Secondary | ICD-10-CM | POA: Diagnosis not present

## 2017-12-14 ENCOUNTER — Encounter: Payer: Self-pay | Admitting: Medical Oncology

## 2017-12-18 ENCOUNTER — Telehealth: Payer: Self-pay | Admitting: Medical Oncology

## 2017-12-18 NOTE — Telephone Encounter (Signed)
Spoke with [name] to confirm [facility] appointment 9/24 arriving at 12:30 pm. I reviewed valet parking, registration and  reminded him to bring  completed medical forms. I also asked him to have lunch before arrival due to length of clinic. He voiced understanding.

## 2017-12-18 NOTE — Progress Notes (Signed)
GU Location of Tumor / Histology: prostatic adenocarcinoma  If Prostate Cancer, Gleason Score is (4 + 5) and PSA is (111). Prostate volume: 43 grams.  Jeff Alexander was referred by Dr. Jill Alexanders in July for further evaluation and management of BPH and lower tract symptoms. His symptoms began approximately 06/26/2017.   Biopsies of prostate (if applicable) revealed:    Past/Anticipated interventions by urology, if any: DRE, doubled tamsulosin, prostate biopsy, referral to [facility]  Past/Anticipated interventions by medical oncology, if any: no  Weight changes, if any: no  Bowel/Bladder complaints, if any: frequency, urgency, weak stream, and sense of incomplete emptying.  Nausea/Vomiting, if any: no  Pain issues, if any:  Chronic back pain  SAFETY ISSUES:  Prior radiation? no  Pacemaker/ICD? no  Possible current pregnancy? no  Is the patient on methotrexate? no  Current Complaints / other details:  57 year old male. Single. Denies a family hx of prostate cancer. Reports excellent erectile function. AX: codeine

## 2017-12-19 ENCOUNTER — Encounter: Payer: Self-pay | Admitting: Medical Oncology

## 2017-12-19 ENCOUNTER — Encounter: Payer: Self-pay | Admitting: Radiation Oncology

## 2017-12-19 ENCOUNTER — Encounter: Payer: Self-pay | Admitting: General Practice

## 2017-12-19 ENCOUNTER — Ambulatory Visit
Admission: RE | Admit: 2017-12-19 | Discharge: 2017-12-19 | Disposition: A | Discharge status: Home / Self Care | Payer: BLUE CROSS/BLUE SHIELD | Source: Ambulatory Visit | Attending: Radiation Oncology | Admitting: Radiation Oncology

## 2017-12-19 ENCOUNTER — Other Ambulatory Visit: Payer: Self-pay

## 2017-12-19 ENCOUNTER — Inpatient Hospital Stay: Payer: BLUE CROSS/BLUE SHIELD | Attending: Oncology | Admitting: Oncology

## 2017-12-19 DIAGNOSIS — G8929 Other chronic pain: Secondary | ICD-10-CM | POA: Insufficient documentation

## 2017-12-19 DIAGNOSIS — R972 Elevated prostate specific antigen [PSA]: Secondary | ICD-10-CM | POA: Diagnosis not present

## 2017-12-19 DIAGNOSIS — Z808 Family history of malignant neoplasm of other organs or systems: Secondary | ICD-10-CM | POA: Diagnosis not present

## 2017-12-19 DIAGNOSIS — Z885 Allergy status to narcotic agent status: Secondary | ICD-10-CM | POA: Diagnosis not present

## 2017-12-19 DIAGNOSIS — C61 Malignant neoplasm of prostate: Secondary | ICD-10-CM

## 2017-12-19 HISTORY — DX: Other chronic pain: G89.29

## 2017-12-19 HISTORY — DX: Dorsalgia, unspecified: M54.9

## 2017-12-19 HISTORY — DX: Malignant neoplasm of prostate: C61

## 2017-12-19 NOTE — Consult Note (Signed)
[facility]     12/19/2017   --------------------------------------------------------------------------------   Jeff Alexander  MRN: [id number]  PRIMARY CARE:    DOB: 04/11/1960, 57 year old Male  REFERRING:  Jeff Lung, MD  SSN:   PROVIDER:  Louis Alexander, M.D.    TREATING:  Jeff Alexander, M.D.    LOCATION:  [facility] [phone]   --------------------------------------------------------------------------------   CC/HPI: CC: Prostate Cancer   Physician requesting consult: Dr. Burman Alexander  PCP: Dr. Jill Alexander  Location of consult: [facility]   Jeff Alexander is a 57 year old otherwise healthy gentleman who developed fairly acute onset lower urinary tract symptoms in the spring of 2019. He was treated for presumed prostatitis with multiple antibiotics without improvement. He was seen by Dr. Louis Alexander in July and was noted to have bilateral firmness of the prostate with a PSA of 111. He underwent a TRUS biopsy of the prostate on 11/28/17 that confirmed Gleason 4+5=9 adenocarcinoma of the prostate with 11 out of 13 biopsy cores positive for malignancy.   Family history: None.   Imaging studies:  CT pelvis (12/03/17): Negative for metastatic disease. However, he does have a 9 mm right obturator lymph node that appears new compared to his prior CT scan.  Bone scan (12/06/17): No definite evidence of metastatic disease. He did have multiple areas of uptake that are likely consistent with degenerative disease. He did undergo plain film imaging that confirmed degenerative changes.   PMH: He has no medical comorbid conditions.  PSH: No abdominal surgeries.   TNM stage: cT2c N0 M0  PSA: 111  Gleason score: 4+5=9  Biopsy (11/28/17): 11/13 cores positive  Left: L lateral apex (60%, 4+3=7), L apex (80%, 4+4=8), L lateral mid (95%, 4+4=8, PNI), L mid (95%, 4+4=8), L lateral base (95%, 4+4=8, PNI), L base  (95%, 4+5=9, PNI)  Right: R apex (5%, 4+3=7), R lateral apex (30%, 4+3=7), R mid (80%, 4+3=7), R base (90%, 4+3=7, PNI), R lateral base (40%, 4+4=8)  Right SV: Benign  Prostate volume: 43.0 cc   Nomogram  OC disease: 1%  EPE: 99%  SVI: 99%  LNI: 98%  PFS (5 year, 10 year): 9%, 5%   Urinary function: IPSS is 31. He takes tamsulosin.  Erectile function: SHIM score is unable to be obtained. He is sexually inactive.     ALLERGIES: codeine    MEDICATIONS: Loratadine 10 mg tablet     GU PSH: Prostate Needle Biopsy - 11/28/2017    NON-GU PSH: Surgical Pathology, Gross And Microscopic Examination For Prostate Needle - 11/28/2017    GU PMH: Prostate Cancer - 12/05/2017 Elevated PSA - 11/24/2017 Prostate nodule w/ LUTS - 10/13/2017 Urinary Frequency - 10/13/2017 Urinary Urgency - 10/13/2017    NON-GU PMH: No Non-GU PMH    FAMILY HISTORY: Diabetes - Sister   SOCIAL HISTORY: Marital Status: Single Preferred Language: English; Ethnicity: Not Hispanic Or Latino; Race: White Current Smoking Status: Patient has never smoked.   Tobacco Use Assessment Completed: Used Tobacco in last 30 days? Social Drinker.  Drinks 2 caffeinated drinks per day.    REVIEW OF SYSTEMS:    GU Review Male:   Patient denies frequent urination, hard to postpone urination, burning/ pain with urination, get up at night to urinate, leakage of urine, stream starts and stops, trouble starting your streams, and have to strain to urinate .  Gastrointestinal (Upper):   Patient denies nausea and vomiting.  Gastrointestinal (Lower):   Patient denies diarrhea and constipation.  Constitutional:   Patient denies fever, night sweats, weight loss, and fatigue.  Skin:   Patient denies skin rash/ lesion and itching.  Eyes:   Patient denies blurred vision and double vision.  Ears/ Nose/ Throat:   Patient denies sore throat and sinus problems.  Hematologic/Lymphatic:   Patient denies swollen glands and easy bruising.   Cardiovascular:   Patient denies leg swelling and chest pains.  Respiratory:   Patient denies cough and shortness of breath.  Endocrine:   Patient denies excessive thirst.  Musculoskeletal:   Patient denies back pain and joint pain.  Neurological:   Patient denies headaches and dizziness.  Psychologic:   Patient denies depression and anxiety.   VITAL SIGNS: None   MULTI-SYSTEM PHYSICAL EXAMINATION:    Constitutional: Well-nourished. No physical deformities. Normally developed. Good grooming.     PAST DATA REVIEWED:  Source Of History:  Patient  Lab Test Review:   PSA  Records Review:   Pathology Reports, Previous Patient Records  X-Ray Review: C.T. Pelvis: Reviewed Films.  Bone Scan: Reviewed Films.     11/16/17  PSA  Total PSA 111.00 ng/mL    PROCEDURES: None   ASSESSMENT:      ICD-10 Details  1 GU:   Prostate Cancer - C61    PLAN:            Medications Refill Meds: Flomax 0.4 mg capsule 1 capsule PO BID   #60  3 Refill(s)            Document Letter(s):  Created for Patient: Clinical Summary         Notes:   1. High-risk prostate cancer: I had a detailed discussion with Jeff Alexander and his friend today regarding his high-risk prostate cancer situation. Although there is no definite evidence of metastatic disease, he understands that he is at extremely high risk for harboring micro metastatic disease. We did specifically discussed his right obturator lymph node that is not pathologically enlarged but does remain concerning when compared to his prior CT scan.   Ultimately, it was recommended per the multidisciplinary group to proceed with therapy of curative intent in the absence of obvious metastatic disease. He understands the very high risk for recurrence in this situation. We discussed to approaches including primary surgical therapy that would address his significant urinary symptoms likely related to locally advanced disease in the context of a multimodality  approach. Considering his young age, this would be reasonable in we did review the potential risks involved with this approach. We also discussed the approach of proceeding with primary radiation therapy and long-term androgen deprivation. After reviewing the pros and cons of each approach, he is going to also me today with Dr. Tammi Klippel and Dr. Alen Blew to further review his options.   The patient was counseled about the natural history of prostate cancer and the standard treatment options that are available for prostate cancer. It was explained to him how his age and life expectancy, clinical stage, Gleason score, and PSA affect his prognosis, the decision to proceed with additional staging studies, as well as how that information influences recommended treatment strategies. We discussed the roles for active surveillance, radiation therapy, surgical therapy, androgen deprivation, as well as ablative therapy options for the treatment of prostate cancer as appropriate to his individual cancer situation. We discussed the risks and benefits of these options with regard to their impact on cancer control and also in terms  of potential adverse events, complications, and impact on quality of life particularly related to urinary and sexual function. The patient was encouraged to ask questions throughout the discussion today and all questions were answered to his stated satisfaction. In addition, the patient was provided with and/or directed to appropriate resources and literature for further education about prostate cancer and treatment options.    He will consider his options and notify Dr. Louis Alexander how he would like to proceed.    CC: Dr. Burman Alexander  Dr. Jill Alexander  Dr. Tyler Pita  Dr. Zola Button

## 2017-12-19 NOTE — Progress Notes (Signed)
Reason for the request:   Prostate cancer  HPI: I was asked by Dr. Louis Meckel  to evaluate [name] for prostate cancer.  He is a rather healthy gentleman currently of [place] where he lived for over 35 years.  He is started having lower urinary tract symptoms including frequency urgency and incomplete emptying.  He was evaluated by Dr. Louis Meckel and underwent a prostate biopsy on September 3 of 2019.  The biopsy showed a Gleason score 4+5 = 9 and one core and multiple other cores of Gleason 8 and 7.  Staging work-up including CT scan of the abdomen and pelvis showed a 9 mm obturator lymph node enlargement and his bone scan was negative for any metastatic disease.  He was started on Flomax with improvement of his urinary symptoms but continues to have issues however.  He does have some frequency and nocturia which has improved but still fairly disruptive to his lifestyle.  He denies any other complaints and remains in excellent health and performance status.   does not report any headaches, blurry vision, syncope or seizures. Does not report any fevers, chills or sweats.  Does not report any cough, wheezing or hemoptysis.  Does not report any chest pain, palpitation, orthopnea or leg edema.  Does not report any nausea, vomiting or abdominal pain.  Does not report any constipation or diarrhea.  Does not report any skeletal complaints.    Does not report frequency, urgency or hematuria.  Does not report any skin rashes or lesions. Does not report any heat or cold intolerance.  Does not report any lymphadenopathy or petechiae.  Does not report any anxiety or depression.  Remaining review of systems is negative.    His past medical history was reviewed which showed no history of hypertension or diabetes or coronary disease.     Current Outpatient Medications:  .  cetirizine (ZYRTEC) 10 MG tablet, Take 10 mg by mouth daily as needed for allergies., Disp: , Rfl:  .  ciprofloxacin (CIPRO) 500 MG tablet,  Take 1 tablet (500 mg total) by mouth 2 (two) times daily., Disp: 20 tablet, Rfl: 0 .  doxycycline (VIBRA-TABS) 100 MG tablet, Take 1 tablet (100 mg total) by mouth 2 (two) times daily., Disp: 28 tablet, Rfl: 0 .  HYDROcodone-acetaminophen (NORCO) 5-325 MG tablet, Take 1 tablet by mouth every 6 (six) hours as needed for moderate pain. (Patient not taking: Reported on 07/11/2017), Disp: 12 tablet, Rfl: 0 .  metroNIDAZOLE (FLAGYL) 500 MG tablet, Take 1 tablet (500 mg total) by mouth 3 (three) times daily. (Patient not taking: Reported on 07/11/2017), Disp: 21 tablet, Rfl: 0 .  naproxen sodium (ANAPROX) 220 MG tablet, Take 440 mg by mouth 2 (two) times daily as needed (pain)., Disp: , Rfl:  .  sulfamethoxazole-trimethoprim (BACTRIM DS,SEPTRA DS) 800-160 MG tablet, Take 1 tablet by mouth 2 (two) times daily., Disp: 28 tablet, Rfl: 0:  Allergies  Allergen Reactions  . Codeine Nausea And Vomiting  :  No family history on file.:  Social History   Socioeconomic History  . Marital status: Single    Spouse name: Not on file  . Number of children: Not on file  . Years of education: Not on file  . Highest education level: Not on file  Occupational History  . Not on file  Social Needs  . Financial resource strain: Not on file  . Food insecurity:    Worry: Not on file    Inability: Not on file  . Transportation  needs:    Medical: Not on file    Non-medical: Not on file  Tobacco Use  . Smoking status: Never Smoker  . Smokeless tobacco: Never Used  Substance and Sexual Activity  . Alcohol use: No    Alcohol/week: 0.0 standard drinks  . Drug use: Not on file  . Sexual activity: Not on file  Lifestyle  . Physical activity:    Days per week: Not on file    Minutes per session: Not on file  . Stress: Not on file  Relationships  . Social connections:    Talks on phone: Not on file    Gets together: Not on file    Attends religious service: Not on file    Active member of club or  organization: Not on file    Attends meetings of clubs or organizations: Not on file    Relationship status: Not on file  . Intimate partner violence:    Fear of current or ex partner: Not on file    Emotionally abused: Not on file    Physically abused: Not on file    Forced sexual activity: Not on file  Other Topics Concern  . Not on file  Social History Narrative  . Not on file  :  Pertinent items are noted in HPI.  Exam: ECOG 0 General appearance: alert and cooperative appeared without distress. Head: atraumatic without any abnormalities. Eyes: conjunctivae/corneas clear. PERRL.  Sclera anicteric. Throat: lips, mucosa, and tongue normal; without oral thrush or ulcers. Resp: clear to auscultation bilaterally without rhonchi, wheezes or dullness to percussion. Cardio: regular rate and rhythm, S1, S2 normal, no murmur, click, rub or gallop GI: soft, non-tender; bowel sounds normal; no masses,  no organomegaly Skin: Skin color, texture, turgor normal. No rashes or lesions Lymph nodes: Cervical, supraclavicular, and axillary nodes normal. Neurologic: Grossly normal without any motor, sensory or deep tendon reflexes. Musculoskeletal: No joint deformity or effusion.    Dg Cervical Spine Complete  Result Date: 12/13/2017 CLINICAL DATA:  Followup abnormal bone scan. EXAM: CERVICAL SPINE - COMPLETE 4+ VIEW COMPARISON:  Bone scan 12/11/2017 FINDINGS: Advanced degenerative cervical spondylosis with multilevel disc disease and facet disease likely accounting for the bone scan abnormality. No acute bony findings or worrisome bone lesions. Multilevel uncinate spurring changes with mild foraminal narrowing. This is most significant at at C4-5 on the left and C5-6 on the right. The C1-2 articulations are maintained. The lung apices are clear. IMPRESSION: Advanced degenerative changes for age likely accounting for the bone scan abnormality. No worrisome bone lesions. Electronically Signed   By: Marijo Sanes M.D.   On: 12/13/2017 17:10   Dg Thoracic Spine W/swimmers  Result Date: 12/13/2017 CLINICAL DATA:  Followup abnormal bone scan. EXAM: THORACIC SPINE - 3 VIEWS COMPARISON:  Whole-body bone scan 12/11/2017 FINDINGS: Left convex thoracolumbar scoliosis. Mild degenerative changes in the thoracic spine but no acute bony findings or worrisome bone lesions. The visualized lungs are clear. The visualized posterior ribs are intact. IMPRESSION: Scoliosis and degenerative changes in the thoracic spine, likely accounting for the patient's bone scan findings. No worrisome bone lesions or acute bony findings. Electronically Signed   By: Marijo Sanes M.D.   On: 12/13/2017 17:11   Dg Lumbar Spine Complete  Result Date: 12/13/2017 CLINICAL DATA:  Followup abnormal bone scan. EXAM: LUMBAR SPINE - COMPLETE 4+ VIEW COMPARISON:  Whole-body bone scan 12/11/2017 FINDINGS: Right convex lumbar scoliosis with associated degenerative lumbar spondylosis. Degenerate disc disease and facet disease  most notable at L4-5 and L5-S1. No worrisome bone lesions or destructive bony changes. No pars defects. Scattered aortic calcifications without definite aneurysm. IMPRESSION: Scoliosis and degenerate disc disease and facet disease likely accounting for the patient's bone scan abnormalities. No worrisome bone lesions or acute fracture. Electronically Signed   By: Marijo Sanes M.D.   On: 12/13/2017 17:12   Nm Bone Scan Whole Body  Result Date: 12/12/2017 CLINICAL DATA:  Prostate cancer. EXAM: NUCLEAR MEDICINE WHOLE BODY BONE SCAN TECHNIQUE: Whole body anterior and posterior images were obtained approximately 3 hours after intravenous injection of radiopharmaceutical. RADIOPHARMACEUTICALS:  20.6 mCi Technetium-19m MDP IV COMPARISON:  CT 12/04/2017. FINDINGS: Bilateral renal function and excretion. Very subtle focal areas of increased activity noted over the cervical and upper/midthoracic spine as well as the mid lumbar spine.  These changes may be degenerative. Subtle metastatic foci cannot be entirely excluded. Focal area of increased activity noted over the medial aspect of the distal right femoral metaphysis. Subtle metastatic foci in this region cannot be excluded. Focal area of increased activity noted over the left maxilla. This could be possibly related to sinus disease and/or dental disease. Again subtle metastatic foci cannot be entirely excluded. Further evaluation of these areas with plain film imaging can be obtained as needed. IMPRESSION: 1. Very subtle focal areas of increased activity noted over the cervical, upper/midthoracic spine, and mid lumbar spine. These changes may be degenerative. Subtle metastatic foci cannot be entirely excluded. Further evaluation can be obtained with plain film imaging and MRI as needed. 2. Focal area of increased activity noted over the medial aspect of the distal right femoral metaphysis. Focal area of increased activity noted over the left maxilla. Again subtle metastatic foci cannot be entirely excluded. Further evaluation of these areas with plain film imaging can be obtained. Electronically Signed   By: Marcello Moores  Register   On: 12/12/2017 05:56    Assessment and Plan:    57 year old man with prostate cancer diagnosed in September 2019.  He presented with PSA of 111 and a Gleason score 4+5 = 9 with high volume high risk disease.  His staging work-up did not show any evidence of metastatic disease.  His bone scan did show some degenerative changes confirmed by plain film x-rays.  His case was discussed today the prostate cancer multidisciplinary clinic.  This includes review and discussion with the reviewing pathologist as well as reviewing imaging studies with the reviewing radiologist.  Options of therapy were discussed today with the patient.  These were include primary surgical therapy versus androgen deprivation therapy for 2 years in addition to radiation using external beam.   Risks and benefits of both approaches were reviewed today as well as potential complications.  He would be an excellent candidate for both options at this time.  Given his lower urinary tract symptoms he might benefit from primary surgical therapy but he also understands he may require adjuvant radiation therapy with androgen deprivation depending on his disease status given his high risk and high volume disease.  We also discussed the modalities are used to treat advanced prostate cancer in the form of second line hormone therapy, chemotherapy and other systemic options in case he develops advanced disease.  He understands these options are not curative but can offer palliative option in the future.  All his questions were answered to his satisfaction.  He will make his decision in the future regarding treatment options.  30  minutes was spent with the patient face-to-face today.  More than 50% of time was dedicated to patient counseling, education and discussing the natural course of his disease and treatment options.   Thank you for the referral.  A copy of this consult has been forwarded to the requesting physician.

## 2017-12-19 NOTE — Progress Notes (Signed)
[facility] Psychosocial Distress Screening Spiritual Care  Met with Kale and his friend in [facility] to introduce [facility] team/resources, reviewing distress screen per protocol.  The patient scored a 7 on the Psychosocial Distress Thermometer which indicates severe distress. Also assessed for distress and other psychosocial needs.   ONCBCN DISTRESS SCREENING 12/19/2017  Screening Type Initial Screening  Distress experienced in past week (1-10) 7  Emotional problem type Nervousness/Anxiety;Adjusting to illness  Physical Problem type Changes in urination  Referral to support programs Yes   Mr Kovalcik enjoys walks in nature, visual puns, photography and other creative practices as ways of making meaning and bringing joy/peace. He reports good support from family and friends. Encouraged [employer] healing arts programs, use of healing garden, and other [employer] for further coping and fun. Per pt, meeting team and resolving the unknown with treatment choices has helped him feel more comfortable and less anxious.  Follow up needed: No. Per pt, no other needs at this time, but he knows to contact Team whenever desired. Please also page if needs arise or circumstances change. Thank you.   [place], [place], [facility] Pager [phone] Voicemail [phone]

## 2017-12-19 NOTE — Progress Notes (Signed)
Radiation Oncology         [phone] ________________________________  Multidisciplinary Prostate Cancer Clinic  Initial Radiation Oncology Consultation  Name: Jeff Alexander MRN: [id number]  Date: 12/19/2017  DOB: 1961-03-02  SW:NIOEVOJ, [name], MD  Raynelle Bring, MD   REFERRING PHYSICIAN: Raynelle Bring, MD  DIAGNOSIS: 57 y.o. gentleman with stage T2c adenocarcinoma of the prostate with a Gleason's score of 4+5 and a PSA of 111.0    ICD-10-CM   1. Malignant neoplasm of prostate ([place]) C61     HISTORY OF PRESENT ILLNESS::Jeff Alexander is a 56 y.o. gentleman with a diagnosis of prostate cancer.  He initially presented to his primary care physician, Dr. Jill Alexanders, with voiding symptoms in 05/2017 and was treated with several rounds of antibiotics for suspected prostatitis.  His symptoms did not improve with treatment so he was referred for evaluation in urology by Dr. Louis Meckel on 10/13/2017, where a digital rectal examination was performed at that time revealing a very firm irregular prostate centrally involving both lobes but with no discrete nodule.  The patient was treated with anti-inflammatory and tamsulosin for prostatitis with only mild improvement.  Given his concerning prostate exam, a PSA was performed following treatment and was significantly elevated at 111.0.  The patient proceeded to transrectal ultrasound with 12 biopsies of the prostate on 11/28/2017.  The prostate volume measured 43 cc. Out of 12 core biopsies, 11 were positive.  The maximum Gleason score was 4+5, and this was seen in the left base. Gleason 4+4 was seen in the left base lateral, left mid lateral, left mid, left apex, right base lateral. Gleason 4+3 was seen in the left apex lateral, right base, right mid, right apex, and right apex lateral. Additionally, the patient had 1 negative biopsy of the right seminal vesicle.   CT Pelvis on 12/04/2017 showed no evidence for pathologic lymphadenopathy in the pelvis,  however upon further review in [place] today there is a suspicious 9 mm right obturator lymph node, which is new since previous studies. Bone scan on 12/11/2017 showed very subtle focal areas of increased activity noted over the cervical, upper/midthoracic spine, and mid-lumbar spine.  Further evaluation with plain film radiographs of the spine showed only degenerative changes; no suspicious lesions.  The patient reviewed the biopsy results with his urologist and he has kindly been referred today to the multidisciplinary prostate cancer clinic for presentation of pathology and radiology studies in our conference for discussion of potential radiation treatment options and clinical evaluation.  PREVIOUS RADIATION THERAPY: No  PAST MEDICAL HISTORY:  has a past medical history of Chronic back pain and Prostate cancer ([place]).    PAST SURGICAL HISTORY:History reviewed. No pertinent surgical history.  FAMILY HISTORY: family history includes Breast cancer (age of onset: 28) in his maternal aunt; Melanoma (age of onset: 49) in his maternal aunt.  SOCIAL HISTORY:  reports that he has never smoked. He has never used smokeless tobacco. He reports that he drinks about 3.0 standard drinks of alcohol per week. Single. No children. Works in [employer].  ALLERGIES: Codeine  MEDICATIONS:  Current Outpatient Medications  Medication Sig Dispense Refill  . tamsulosin (FLOMAX) 0.4 MG CAPS capsule   2  . cetirizine (ZYRTEC) 10 MG tablet Take 10 mg by mouth daily as needed for allergies.    . naproxen sodium (ANAPROX) 220 MG tablet Take 440 mg by mouth 2 (two) times daily as needed (pain).     No current facility-administered medications for this  encounter.     REVIEW OF SYSTEMS:  On review of systems, the patient reports that he is doing well overall. He denies any chest pain, shortness of breath, cough, fevers, chills, night sweats, or unintended weight changes. He reports loss of sleep and fatigue. He reports  headaches. He reports anxiety. He denies any bowel disturbances, and denies abdominal pain, nausea or vomiting. He reports chronic back and neck pain but denies any new musculoskeletal or joint aches or pains. His IPSS was 24, indicating severe urinary symptoms with incomplete emptying, frequency, intermittency, weak stream, and nocturia x2. He is currently not sexually active. A complete review of systems is obtained and is otherwise negative.   PHYSICAL EXAM:  Wt Readings from Last 3 Encounters:  12/19/17 145 lb 9.6 oz (66 kg)  07/11/17 150 lb 6.4 oz (68.2 kg)  06/16/15 151 lb 12.8 oz (68.9 kg)   Temp Readings from Last 3 Encounters:  12/19/17 98.4 F (36.9 C)  07/11/17 97.9 F (36.6 C)  06/07/15 98.8 F (37.1 C) (Oral)   BP Readings from Last 3 Encounters:  12/19/17 (!) 149/96  07/11/17 138/86  06/16/15 128/82   Pulse Readings from Last 3 Encounters:  12/19/17 98  07/11/17 77  06/16/15 94   Pain Assessment Pain Score: 0-No pain/10  In general this is a well appearing Caucasian male in no acute distress. He is alert and oriented x4 and appropriate throughout the examination. HEENT reveals that the patient is normocephalic, atraumatic. EOMs are intact. PERRLA. Skin is intact without any evidence of gross lesions. Cardiovascular exam reveals a regular rate and rhythm, no clicks rubs or murmurs are auscultated. Chest is clear to auscultation bilaterally. Lymphatic assessment is performed and does not reveal any adenopathy in the cervical, supraclavicular, axillary, or inguinal chains. Abdomen has active bowel sounds in all quadrants and is intact. The abdomen is soft, non tender, non distended. Lower extremities are negative for pretibial pitting edema, deep calf tenderness, cyanosis or clubbing.  KPS = 100  100 - Normal; no complaints; no evidence of disease. 90   - Able to carry on normal activity; minor signs or symptoms of disease. 80   - Normal activity with effort; some signs  or symptoms of disease. 74   - Cares for self; unable to carry on normal activity or to do active work. 60   - Requires occasional assistance, but is able to care for most of his personal needs. 50   - Requires considerable assistance and frequent medical care. 60   - Disabled; requires special care and assistance. 47   - Severely disabled; hospital admission is indicated although death not imminent. 45   - Very sick; hospital admission necessary; active supportive treatment necessary. 10   - Moribund; fatal processes progressing rapidly. 0     - Dead  Karnofsky DA, Abelmann [place], Craver LS and Burchenal [facility] [phone]) The use of the nitrogen mustards in the palliative treatment of carcinoma: with particular reference to bronchogenic carcinoma Cancer 1 [id number]   LABORATORY DATA:  Lab Results  Component Value Date   WBC 16.4 (H) 06/07/2015   HGB 14.8 06/07/2015   HCT 41.5 06/07/2015   MCV 83.8 06/07/2015   PLT 240 06/07/2015   Lab Results  Component Value Date   NA 137 06/07/2015   K 4.5 06/07/2015   CL 102 06/07/2015   CO2 26 06/07/2015   Lab Results  Component Value Date   ALT 18 06/07/2015   AST 16 06/07/2015  ALKPHOS 67 06/07/2015   BILITOT 1.0 06/07/2015     RADIOGRAPHY: Dg Cervical Spine Complete  Result Date: 12/13/2017 CLINICAL DATA:  Followup abnormal bone scan. EXAM: CERVICAL SPINE - COMPLETE 4+ VIEW COMPARISON:  Bone scan 12/11/2017 FINDINGS: Advanced degenerative cervical spondylosis with multilevel disc disease and facet disease likely accounting for the bone scan abnormality. No acute bony findings or worrisome bone lesions. Multilevel uncinate spurring changes with mild foraminal narrowing. This is most significant at at C4-5 on the left and C5-6 on the right. The C1-2 articulations are maintained. The lung apices are clear. IMPRESSION: Advanced degenerative changes for age likely accounting for the bone scan abnormality. No worrisome bone lesions. Electronically Signed    By: Marijo Sanes M.D.   On: 12/13/2017 17:10   Dg Thoracic Spine W/swimmers  Result Date: 12/13/2017 CLINICAL DATA:  Followup abnormal bone scan. EXAM: THORACIC SPINE - 3 VIEWS COMPARISON:  Whole-body bone scan 12/11/2017 FINDINGS: Left convex thoracolumbar scoliosis. Mild degenerative changes in the thoracic spine but no acute bony findings or worrisome bone lesions. The visualized lungs are clear. The visualized posterior ribs are intact. IMPRESSION: Scoliosis and degenerative changes in the thoracic spine, likely accounting for the patient's bone scan findings. No worrisome bone lesions or acute bony findings. Electronically Signed   By: Marijo Sanes M.D.   On: 12/13/2017 17:11   Dg Lumbar Spine Complete  Result Date: 12/13/2017 CLINICAL DATA:  Followup abnormal bone scan. EXAM: LUMBAR SPINE - COMPLETE 4+ VIEW COMPARISON:  Whole-body bone scan 12/11/2017 FINDINGS: Right convex lumbar scoliosis with associated degenerative lumbar spondylosis. Degenerate disc disease and facet disease most notable at L4-5 and L5-S1. No worrisome bone lesions or destructive bony changes. No pars defects. Scattered aortic calcifications without definite aneurysm. IMPRESSION: Scoliosis and degenerate disc disease and facet disease likely accounting for the patient's bone scan abnormalities. No worrisome bone lesions or acute fracture. Electronically Signed   By: Marijo Sanes M.D.   On: 12/13/2017 17:12   Nm Bone Scan Whole Body  Result Date: 12/12/2017 CLINICAL DATA:  Prostate cancer. EXAM: NUCLEAR MEDICINE WHOLE BODY BONE SCAN TECHNIQUE: Whole body anterior and posterior images were obtained approximately 3 hours after intravenous injection of radiopharmaceutical. RADIOPHARMACEUTICALS:  20.6 mCi Technetium-60m MDP IV COMPARISON:  CT 12/04/2017. FINDINGS: Bilateral renal function and excretion. Very subtle focal areas of increased activity noted over the cervical and upper/midthoracic spine as well as the mid lumbar  spine. These changes may be degenerative. Subtle metastatic foci cannot be entirely excluded. Focal area of increased activity noted over the medial aspect of the distal right femoral metaphysis. Subtle metastatic foci in this region cannot be excluded. Focal area of increased activity noted over the left maxilla. This could be possibly related to sinus disease and/or dental disease. Again subtle metastatic foci cannot be entirely excluded. Further evaluation of these areas with plain film imaging can be obtained as needed. IMPRESSION: 1. Very subtle focal areas of increased activity noted over the cervical, upper/midthoracic spine, and mid lumbar spine. These changes may be degenerative. Subtle metastatic foci cannot be entirely excluded. Further evaluation can be obtained with plain film imaging and MRI as needed. 2. Focal area of increased activity noted over the medial aspect of the distal right femoral metaphysis. Focal area of increased activity noted over the left maxilla. Again subtle metastatic foci cannot be entirely excluded. Further evaluation of these areas with plain film imaging can be obtained. Electronically Signed   By: Marcello Moores  Register   On:  12/12/2017 05:56      IMPRESSION/PLAN: 57 y.o. gentleman with stage T2c adenocarcinoma of the prostate with a Gleason's score of 4+5 and a PSA of 111.0. We discussed the patient's workup and outlined the nature of prostate cancer in this setting. The patient's T stage, Gleason score, and PSA put him into the high risk group. Accordingly, he is eligible for a variety of potential treatment options including LT-ADT combined with 8 weeks of external radiation or prostatectomy. We discussed the available radiation techniques, and focused on the details and logistics and delivery. We discussed and outlined the risks, benefits, short and long-term effects associated with radiotherapy and compared and contrasted these with prostatectomy. We also detailed the role  of ADT in the treatment of high-risk prostate cancer and outlined the associated side effects that could be expected with this therapy.  He was encouraged to ask questions that were answered to his stated satisfaction.  At the end of the conversation the patient is undecided regarding his final treatment preference and requests some additional time for consideration of his options.  We will share our findings with Dr. Louis Meckel and plan to follow up with the patient in the next 1-2 weeks to answer any further questions and ascertain his readiness to commit to treatment at that time.  Pending his final treatment decision, we will move forward with treatment planning accordingly at that time.  If he elects to move forward with radiotherapy, we would anticipate starting ADT around the end of September followed by an 8-week course of daily radiotherapy to begin around the end of November, after approximately 8 weeks of ADT.   We spent more than 50% of today's visit in counseling and/or coordination of care.     Nicholos Johns, PA-C    Tyler Pita, MD  [facility] Direct Dial: [phone]  Fax: [phone] [email or web address]  Skype  [email or web address]  This document serves as a record of services personally performed by Tyler Pita, MD and Freeman Caldron, PA-C. It was created on their behalf by Rae Lips, a trained medical scribe. The creation of this record is based on the scribe's personal observations and the providers' statements to them. This document has been checked and approved by the attending providers.

## 2017-12-19 NOTE — Progress Notes (Signed)
                               Care Plan Summary  Name: Jeff Alexander DOB: 21-Mar-1961   Your Medical Team:   Urologist -  Dr. Raynelle Bring, [facility]  Radiation Oncologist - Dr. Tyler Pita, [facility]   Medical Oncologist - Dr. Zola Button, [facility]  Recommendations: 1) Androgen deprivation (hormone injection) for 2 years with 8 weeks  radiation 2) 5 weeks of radiation with seed boost 3) Prostatectomy   * These recommendations are based on information available as of today's consult.      Recommendations may change depending on the results of further tests or exams.   Next Steps: 1) Consider you options and call Jeff Rue, RN with decision    When appointments need to be scheduled, you will be contacted by [facility] and/or [facility].  Questions?  Please do not hesitate to call Jeff Rue, RN, BSN, OCN at [phone] any questions or concerns.  Jeff Alexander is your Oncology Nurse Navigator and is available to assist you while you're receiving your medical care at [facility].

## 2017-12-20 ENCOUNTER — Telehealth: Payer: Self-pay

## 2017-12-20 NOTE — Telephone Encounter (Signed)
Per 9/24 no los 

## 2017-12-21 ENCOUNTER — Telehealth: Payer: Self-pay | Admitting: Medical Oncology

## 2017-12-21 NOTE — Telephone Encounter (Signed)
Jeff Alexander called stating he has decided to proceed with prostatectomy. I will forward his decision to Dr. Alinda Money. I informed him that he will receive scheduling appointments from Dr. Lynne Logan office. He voiced understanding.

## 2017-12-27 ENCOUNTER — Telehealth: Payer: Self-pay

## 2017-12-27 MED ORDER — ALPRAZOLAM 0.25 MG PO TABS: 0.2500 mg | ORAL_TABLET | Freq: Two times a day (BID) | ORAL | 0 refills | Status: DC | PRN

## 2017-12-27 NOTE — Telephone Encounter (Signed)
Pt wants a call back, he has increased anxiety due to prostate cancer and he wants to speak to his PCP. Pt was offered an appointment to see Vickie since his anxiety is increased but he stated he prefers to speak with his PCP and that his anxiety was affecting his wellbeing. Please advise.     Pt can be reached at [phone]

## 2017-12-29 ENCOUNTER — Telehealth: Payer: Self-pay | Admitting: Medical Oncology

## 2017-12-29 NOTE — Telephone Encounter (Signed)
Jeff Alexander called and asking for update on surgery date for prostatectomy. I explained that Dr. Alinda Money informed me he is checking to see if he or Dr. Louis Meckel can get him scheduled first. I will continue to follow. He voiced understanding.

## 2018-01-01 ENCOUNTER — Other Ambulatory Visit: Payer: Self-pay | Admitting: Urology

## 2018-01-03 NOTE — Patient Instructions (Addendum)
Jeff Alexander  01/03/2018   Your procedure is scheduled on:01-12-18   Report to [place]  Entrance   Report to admitting at     1200  PM    Call this number if you have problems the morning of surgery [phone]               DRINK ONE BOTTLE OF MAGNESIUM CITRATE BY NOON THE DAY [place]. PER YOUR MD ORDERS   Remember: Do not eat food:After Midnight  You may have clear liquids until 0800 am then nothing by mouth    CLEAR LIQUID DIET   Foods Allowed                                                                     Foods Excluded  Coffee and tea, regular and decaf                             liquids that you cannot  Plain Jell-O in any flavor                                             see through such as: Fruit ices (not with fruit pulp)                                     milk, soups, orange juice  Iced Popsicles                                    All solid food Carbonated beverages, regular and diet                                    Cranberry, grape and apple juices Sports drinks like Gatorade Lightly seasoned clear broth or consume(fat free) Sugar, honey syrup    _____________________________________________________________________    BRUSH YOUR TEETH MORNING OF SURGERY AND RINSE YOUR MOUTH OUT, NO CHEWING GUM CANDY OR MINTS.     Take these medicines the morning of surgery with A SIP OF WATER: flomax and xanax if needed                                You may not have any metal on your body including hair pins and              piercings  Do not wear jewelry,, lotions, powders or perfumes, deodorant                         Men may shave face and neck.  Do not bring valuables to the hospital. [place].  Contacts, dentures or bridgework may not be worn into surgery.  Leave suitcase in the car. After surgery it  may be brought to your room.                  Please read over the following fact sheets you were given: _____________________________________________________________________           [facility] - Preparing for Surgery Before surgery, you can play an important role.  Because skin is not sterile, your skin needs to be as free of germs as possible.  You can reduce the number of germs on your skin by washing with CHG (chlorahexidine gluconate) soap before surgery.  CHG is an antiseptic cleaner which kills germs and bonds with the skin to continue killing germs even after washing. Please DO NOT use if you have an allergy to CHG or antibacterial soaps.  If your skin becomes reddened/irritated stop using the CHG and inform your nurse when you arrive at Short Stay. Do not shave (including legs and underarms) for at least 48 hours prior to the first CHG shower.  You may shave your face/neck. Please follow these instructions carefully:  1.  Shower with CHG Soap the night before surgery and the  morning of Surgery.  2.  If you choose to wash your hair, wash your hair first as usual with your  normal  shampoo.  3.  After you shampoo, rinse your hair and body thoroughly to remove the  shampoo.                           4.  Use CHG as you would any other liquid soap.  You can apply chg directly  to the skin and wash                       Gently with a scrungie or clean washcloth.  5.  Apply the CHG Soap to your body ONLY FROM THE NECK DOWN.   Do not use on face/ open                           Wound or open sores. Avoid contact with eyes, ears mouth and genitals (private parts).                       Wash face,  Genitals (private parts) with your normal soap.             6.  Wash thoroughly, paying special attention to the area where your surgery  will be performed.  7.  Thoroughly rinse your body with warm water from the neck down.  8.  DO NOT shower/wash with your normal soap after using and rinsing off   the CHG Soap.                9.  Pat yourself dry with a clean towel.            10.  Wear clean pajamas.            11.  Place clean sheets on your bed the night of your first shower and do not  sleep with pets. Day of Surgery : Do not apply any  lotions/deodorants the morning of surgery.  Please wear clean clothes to the hospital/surgery center.  FAILURE TO FOLLOW THESE INSTRUCTIONS MAY RESULT IN THE CANCELLATION OF YOUR SURGERY PATIENT SIGNATURE_________________________________  NURSE SIGNATURE__________________________________  ________________________________________________________________________  WHAT IS A BLOOD TRANSFUSION? Blood Transfusion Information  A transfusion is the replacement of blood or some of its parts. Blood is made up of multiple cells which provide different functions.  Red blood cells carry oxygen and are used for blood loss replacement.  White blood cells fight against infection.  Platelets control bleeding.  Plasma helps clot blood.  Other blood products are available for specialized needs, such as hemophilia or other clotting disorders. BEFORE THE TRANSFUSION  Who gives blood for transfusions?   Healthy volunteers who are fully evaluated to make sure their blood is safe. This is blood bank blood. Transfusion therapy is the safest it has ever been in the practice of medicine. Before blood is taken from a donor, a complete history is taken to make sure that person has no history of diseases nor engages in risky social behavior (examples are intravenous drug use or sexual activity with multiple partners). The donor's travel history is screened to minimize risk of transmitting infections, such as malaria. The donated blood is tested for signs of infectious diseases, such as HIV and hepatitis. The blood is then tested to be sure it is compatible with you in order to minimize the chance of a transfusion reaction. If you or a relative donates blood, this is often  done in anticipation of surgery and is not appropriate for emergency situations. It takes many days to process the donated blood. RISKS AND COMPLICATIONS Although transfusion therapy is very safe and saves many lives, the main dangers of transfusion include:   Getting an infectious disease.  Developing a transfusion reaction. This is an allergic reaction to something in the blood you were given. Every precaution is taken to prevent this. The decision to have a blood transfusion has been considered carefully by your caregiver before blood is given. Blood is not given unless the benefits outweigh the risks. AFTER THE TRANSFUSION  Right after receiving a blood transfusion, you will usually feel much better and more energetic. This is especially true if your red blood cells have gotten low (anemic). The transfusion raises the level of the red blood cells which carry oxygen, and this usually causes an energy increase.  The nurse administering the transfusion will monitor you carefully for complications. HOME CARE INSTRUCTIONS  No special instructions are needed after a transfusion. You may find your energy is better. Speak with your caregiver about any limitations on activity for underlying diseases you may have. SEEK MEDICAL CARE IF:   Your condition is not improving after your transfusion.  You develop redness or irritation at the intravenous (IV) site. SEEK IMMEDIATE MEDICAL CARE IF:  Any of the following symptoms occur over the next 12 hours:  Shaking chills.  You have a temperature by mouth above 102 F (38.9 C), not controlled by medicine.  Chest, back, or muscle pain.  People around you feel you are not acting correctly or are confused.  Shortness of breath or difficulty breathing.  Dizziness and fainting.  You get a rash or develop hives.  You have a decrease in urine output.  Your urine turns a dark color or changes to pink, red, or brown. Any of the following symptoms  occur over the next 10 days:  You have a temperature by mouth above 102 F (38.9  C), not controlled by medicine.  Shortness of breath.  Weakness after normal activity.  The white part of the eye turns yellow (jaundice).  You have a decrease in the amount of urine or are urinating less often.  Your urine turns a dark color or changes to pink, red, or brown. Document Released: 03/11/2000 Document Revised: 06/06/2011 Document Reviewed: 10/29/2007 ExitCare Patient Information 2014 [place].  _______________________________________________________________________  Incentive Spirometer  An incentive spirometer is a tool that can help keep your lungs clear and active. This tool measures how well you are filling your lungs with each breath. Taking long deep breaths may help reverse or decrease the chance of developing breathing (pulmonary) problems (especially infection) following:  A long period of time when you are unable to move or be active. BEFORE THE PROCEDURE   If the spirometer includes an indicator to show your best effort, your nurse or respiratory therapist will set it to a desired goal.  If possible, sit up straight or lean slightly forward. Try not to slouch.  Hold the incentive spirometer in an upright position. INSTRUCTIONS FOR USE  1. Sit on the edge of your bed if possible, or sit up as far as you can in bed or on a chair. 2. Hold the incentive spirometer in an upright position. 3. Breathe out normally. 4. Place the mouthpiece in your mouth and seal your lips tightly around it. 5. Breathe in slowly and as deeply as possible, raising the piston or the ball toward the top of the column. 6. Hold your breath for 3-5 seconds or for as long as possible. Allow the piston or ball to fall to the bottom of the column. 7. Remove the mouthpiece from your mouth and breathe out normally. 8. Rest for a few seconds and repeat Steps 1 through 7 at least 10 times every 1-2 hours  when you are awake. Take your time and take a few normal breaths between deep breaths. 9. The spirometer may include an indicator to show your best effort. Use the indicator as a goal to work toward during each repetition. 10. After each set of 10 deep breaths, practice coughing to be sure your lungs are clear. If you have an incision (the cut made at the time of surgery), support your incision when coughing by placing a pillow or rolled up towels firmly against it. Once you are able to get out of bed, walk around indoors and cough well. You may stop using the incentive spirometer when instructed by your caregiver.  RISKS AND COMPLICATIONS  Take your time so you do not get dizzy or light-headed.  If you are in pain, you may need to take or ask for pain medication before doing incentive spirometry. It is harder to take a deep breath if you are having pain. AFTER USE  Rest and breathe slowly and easily.  It can be helpful to keep track of a log of your progress. Your caregiver can provide you with a simple table to help with this. If you are using the spirometer at home, follow these instructions: [place] IF:   You are having difficultly using the spirometer.  You have trouble using the spirometer as often as instructed.  Your pain medication is not giving enough relief while using the spirometer.  You develop fever of 100.5 F (38.1 C) or higher. SEEK IMMEDIATE MEDICAL CARE IF:   You cough up bloody sputum that had not been present before.  You develop fever of  102 F (38.9 C) or greater.  You develop worsening pain at or near the incision site. MAKE SURE YOU:   Understand these instructions.  Will watch your condition.  Will get help right away if you are not doing well or get worse. Document Released: 07/25/2006 Document Revised: 06/06/2011 Document Reviewed: 09/25/2006 [facility] Patient Information 2014 [place],  [place].   ________________________________________________________________________

## 2018-01-05 DIAGNOSIS — M6281 Muscle weakness (generalized): Secondary | ICD-10-CM | POA: Diagnosis not present

## 2018-01-05 DIAGNOSIS — R350 Frequency of micturition: Secondary | ICD-10-CM | POA: Diagnosis not present

## 2018-01-05 DIAGNOSIS — M62838 Other muscle spasm: Secondary | ICD-10-CM | POA: Diagnosis not present

## 2018-01-05 DIAGNOSIS — C61 Malignant neoplasm of prostate: Secondary | ICD-10-CM | POA: Diagnosis not present

## 2018-01-08 DIAGNOSIS — M6281 Muscle weakness (generalized): Secondary | ICD-10-CM | POA: Diagnosis not present

## 2018-01-08 DIAGNOSIS — M62838 Other muscle spasm: Secondary | ICD-10-CM | POA: Diagnosis not present

## 2018-01-08 DIAGNOSIS — C61 Malignant neoplasm of prostate: Secondary | ICD-10-CM | POA: Diagnosis not present

## 2018-01-09 ENCOUNTER — Other Ambulatory Visit: Payer: Self-pay

## 2018-01-09 ENCOUNTER — Encounter (HOSPITAL_COMMUNITY)
Admission: RE | Admit: 2018-01-09 | Discharge: 2018-01-09 | Disposition: A | Discharge status: Home / Self Care | Payer: BLUE CROSS/BLUE SHIELD | Source: Ambulatory Visit | Attending: Urology | Admitting: Urology

## 2018-01-09 ENCOUNTER — Encounter (HOSPITAL_COMMUNITY): Payer: Self-pay

## 2018-01-09 DIAGNOSIS — C61 Malignant neoplasm of prostate: Secondary | ICD-10-CM | POA: Diagnosis not present

## 2018-01-09 DIAGNOSIS — Z01818 Encounter for other preprocedural examination: Secondary | ICD-10-CM | POA: Diagnosis not present

## 2018-01-09 HISTORY — DX: Anxiety disorder, unspecified: F41.9

## 2018-01-09 HISTORY — DX: Headache: R51

## 2018-01-09 HISTORY — DX: Headache, unspecified: R51.9

## 2018-01-09 LAB — COMPREHENSIVE METABOLIC PANEL
ALT: 20 U/L (ref 0–44)
AST: 20 U/L (ref 15–41)
Albumin: 4.2 g/dL (ref 3.5–5.0)
Alkaline Phosphatase: 60 U/L (ref 38–126)
Anion gap: 8 (ref 5–15)
BUN: 13 mg/dL (ref 6–20)
CHLORIDE: 104 mmol/L (ref 98–111)
CO2: 28 mmol/L (ref 22–32)
CREATININE: 0.95 mg/dL (ref 0.61–1.24)
Calcium: 9.4 mg/dL (ref 8.9–10.3)
GFR calc Af Amer: >60 mL/min (ref >60)
Glucose, Bld: 105 mg/dL — ABNORMAL HIGH (ref 70–99)
Potassium: 4.4 mmol/L (ref 3.5–5.1)
Sodium: 140 mmol/L (ref 135–145)
Total Bilirubin: 0.9 mg/dL (ref 0.3–1.2)
Total Protein: 7.3 g/dL (ref 6.5–8.1)

## 2018-01-09 LAB — CBC
HCT: 47 % (ref 39.0–52.0)
HEMOGLOBIN: 15.5 g/dL (ref 13.0–17.0)
MCH: 30 pg (ref 26.0–34.0)
MCHC: 33 g/dL (ref 30.0–36.0)
MCV: 90.9 fL (ref 80.0–100.0)
PLATELETS: 230 10*3/uL (ref 150–400)
RBC: 5.17 MIL/uL (ref 4.22–5.81)
RDW: 13.1 % (ref 11.5–15.5)
WBC: 6.7 10*3/uL (ref 4.0–10.5)
nRBC: 0 % (ref 0.0–0.2)

## 2018-01-09 LAB — ABO/RH: ABO/RH(D): O POS

## 2018-01-09 NOTE — Progress Notes (Signed)
Chart left with Dolores Lory for follow up

## 2018-01-11 LAB — URINE CULTURE: CULTURE: NO GROWTH

## 2018-01-12 ENCOUNTER — Other Ambulatory Visit: Payer: Self-pay | Admitting: Family Medicine

## 2018-01-12 ENCOUNTER — Observation Stay (HOSPITAL_COMMUNITY)
Admission: RE | Admit: 2018-01-12 | Discharge: 2018-01-14 | Disposition: A | Discharge status: Home / Self Care | Payer: BLUE CROSS/BLUE SHIELD | Source: Other Acute Inpatient Hospital | Attending: Urology | Admitting: Urology

## 2018-01-12 ENCOUNTER — Encounter (HOSPITAL_COMMUNITY): Payer: Self-pay | Admitting: Certified Registered"

## 2018-01-12 ENCOUNTER — Ambulatory Visit (HOSPITAL_COMMUNITY): Payer: BLUE CROSS/BLUE SHIELD | Admitting: Anesthesiology

## 2018-01-12 ENCOUNTER — Other Ambulatory Visit: Payer: Self-pay

## 2018-01-12 ENCOUNTER — Encounter (HOSPITAL_COMMUNITY)
Admission: RE | Disposition: A | Discharge status: Home / Self Care | Payer: Self-pay | Source: Other Acute Inpatient Hospital | Attending: Urology

## 2018-01-12 DIAGNOSIS — Z79899 Other long term (current) drug therapy: Secondary | ICD-10-CM | POA: Diagnosis not present

## 2018-01-12 DIAGNOSIS — Z885 Allergy status to narcotic agent status: Secondary | ICD-10-CM | POA: Diagnosis not present

## 2018-01-12 DIAGNOSIS — C775 Secondary and unspecified malignant neoplasm of intrapelvic lymph nodes: Secondary | ICD-10-CM | POA: Diagnosis not present

## 2018-01-12 DIAGNOSIS — C7951 Secondary malignant neoplasm of bone: Secondary | ICD-10-CM | POA: Insufficient documentation

## 2018-01-12 DIAGNOSIS — C61 Malignant neoplasm of prostate: Principal | ICD-10-CM | POA: Insufficient documentation

## 2018-01-12 HISTORY — PX: PELVIC LYMPH NODE DISSECTION: SHX6543

## 2018-01-12 HISTORY — PX: ROBOT ASSISTED LAPAROSCOPIC RADICAL PROSTATECTOMY: SHX5141

## 2018-01-12 LAB — TYPE AND SCREEN
ABO/RH(D): O POS
Antibody Screen: NEGATIVE

## 2018-01-12 LAB — HEMOGLOBIN AND HEMATOCRIT, BLOOD
HCT: 42.6 % (ref 39.0–52.0)
Hemoglobin: 13.8 g/dL (ref 13.0–17.0)

## 2018-01-12 SURGERY — PROSTATECTOMY, RADICAL, ROBOT-ASSISTED, LAPAROSCOPIC
Anesthesia: General | Site: Abdomen

## 2018-01-12 MED ORDER — MEPERIDINE HCL 50 MG/ML IJ SOLN
INTRAMUSCULAR | Status: AC
  Filled 2018-01-12: qty 1

## 2018-01-12 MED ORDER — SUGAMMADEX SODIUM 200 MG/2ML IV SOLN
INTRAVENOUS | Status: DC | PRN
  Administered 2018-01-12: 200 mg via INTRAVENOUS

## 2018-01-12 MED ORDER — ACETAMINOPHEN 325 MG PO TABS: 650.0000 mg | ORAL_TABLET | ORAL | Status: DC | PRN

## 2018-01-12 MED ORDER — ONDANSETRON HCL 4 MG/2ML IJ SOLN
INTRAMUSCULAR | Status: AC
  Filled 2018-01-12: qty 2

## 2018-01-12 MED ORDER — ONDANSETRON HCL 4 MG/2ML IJ SOLN
INTRAMUSCULAR | Status: DC | PRN
  Administered 2018-01-12: 4 mg via INTRAVENOUS

## 2018-01-12 MED ORDER — DEXTROSE-NACL 5-0.45 % IV SOLN
INTRAVENOUS | Status: DC
  Administered 2018-01-12 – 2018-01-14 (×3): via INTRAVENOUS

## 2018-01-12 MED ORDER — MIDAZOLAM HCL 2 MG/2ML IJ SOLN
INTRAMUSCULAR | Status: AC
  Filled 2018-01-12: qty 2

## 2018-01-12 MED ORDER — FENTANYL CITRATE (PF) 100 MCG/2ML IJ SOLN
INTRAMUSCULAR | Status: DC | PRN
  Administered 2018-01-12 (×3): 50 ug via INTRAVENOUS
  Administered 2018-01-12: 150 ug via INTRAVENOUS
  Administered 2018-01-12: 50 ug via INTRAVENOUS

## 2018-01-12 MED ORDER — FUROSEMIDE 10 MG/ML IJ SOLN
20.0000 mg | Freq: Once | INTRAMUSCULAR | Status: AC
  Administered 2018-01-12: 20 mg via INTRAVENOUS

## 2018-01-12 MED ORDER — FUROSEMIDE 10 MG/ML IJ SOLN
INTRAMUSCULAR | Status: AC
  Filled 2018-01-12: qty 2

## 2018-01-12 MED ORDER — HYDROMORPHONE HCL 1 MG/ML IJ SOLN: 0.5000 mg | INTRAMUSCULAR | Status: DC | PRN

## 2018-01-12 MED ORDER — BELLADONNA ALKALOIDS-OPIUM 16.2-60 MG RE SUPP
1.0000 | Freq: Four times a day (QID) | RECTAL | Status: DC | PRN
  Administered 2018-01-12: 1 via RECTAL
  Filled 2018-01-12: qty 1

## 2018-01-12 MED ORDER — MEPERIDINE HCL 50 MG/ML IJ SOLN
6.2500 mg | INTRAMUSCULAR | Status: DC | PRN
  Administered 2018-01-12: 12.5 mg via INTRAVENOUS

## 2018-01-12 MED ORDER — MIDAZOLAM HCL 5 MG/5ML IJ SOLN
INTRAMUSCULAR | Status: DC | PRN
  Administered 2018-01-12: 2 mg via INTRAVENOUS

## 2018-01-12 MED ORDER — LIDOCAINE 2% (20 MG/ML) 5 ML SYRINGE
INTRAMUSCULAR | Status: DC | PRN
  Administered 2018-01-12: 60 mg via INTRAVENOUS

## 2018-01-12 MED ORDER — ACETAMINOPHEN 10 MG/ML IV SOLN
1000.0000 mg | Freq: Four times a day (QID) | INTRAVENOUS | Status: AC
  Administered 2018-01-12 – 2018-01-13 (×4): 1000 mg via INTRAVENOUS
  Filled 2018-01-12 (×5): qty 100

## 2018-01-12 MED ORDER — FENTANYL CITRATE (PF) 250 MCG/5ML IJ SOLN
INTRAMUSCULAR | Status: AC
  Filled 2018-01-12: qty 5

## 2018-01-12 MED ORDER — DIPHENHYDRAMINE HCL 50 MG/ML IJ SOLN: 12.5000 mg | Freq: Four times a day (QID) | INTRAMUSCULAR | Status: DC | PRN

## 2018-01-12 MED ORDER — KETOROLAC TROMETHAMINE 15 MG/ML IJ SOLN
15.0000 mg | Freq: Four times a day (QID) | INTRAMUSCULAR | Status: AC | PRN
  Administered 2018-01-13: 15 mg via INTRAVENOUS
  Filled 2018-01-12: qty 1

## 2018-01-12 MED ORDER — FENTANYL CITRATE (PF) 100 MCG/2ML IJ SOLN
INTRAMUSCULAR | Status: AC
  Filled 2018-01-12: qty 2

## 2018-01-12 MED ORDER — BUPIVACAINE-EPINEPHRINE 0.5% -1:200000 IJ SOLN
INTRAMUSCULAR | Status: AC
  Filled 2018-01-12: qty 1

## 2018-01-12 MED ORDER — ROCURONIUM BROMIDE 10 MG/ML (PF) SYRINGE
PREFILLED_SYRINGE | INTRAVENOUS | Status: DC | PRN
  Administered 2018-01-12 (×3): 10 mg via INTRAVENOUS
  Administered 2018-01-12 (×2): 20 mg via INTRAVENOUS
  Administered 2018-01-12: 60 mg via INTRAVENOUS

## 2018-01-12 MED ORDER — DEXAMETHASONE SODIUM PHOSPHATE 10 MG/ML IJ SOLN
INTRAMUSCULAR | Status: AC
  Filled 2018-01-12: qty 1

## 2018-01-12 MED ORDER — DEXAMETHASONE SODIUM PHOSPHATE 10 MG/ML IJ SOLN
INTRAMUSCULAR | Status: DC | PRN
  Administered 2018-01-12: 10 mg via INTRAVENOUS

## 2018-01-12 MED ORDER — METOCLOPRAMIDE HCL 5 MG/ML IJ SOLN: 10.0000 mg | Freq: Once | INTRAMUSCULAR | Status: DC | PRN

## 2018-01-12 MED ORDER — LACTATED RINGERS IV SOLN
INTRAVENOUS | Status: DC
  Administered 2018-01-12 (×3): via INTRAVENOUS

## 2018-01-12 MED ORDER — BUPIVACAINE-EPINEPHRINE 0.5% -1:200000 IJ SOLN
INTRAMUSCULAR | Status: DC | PRN
  Administered 2018-01-12: 30 mL

## 2018-01-12 MED ORDER — LACTATED RINGERS IR SOLN
Status: DC | PRN
  Administered 2018-01-12: 1000 mL

## 2018-01-12 MED ORDER — ROCURONIUM BROMIDE 10 MG/ML (PF) SYRINGE
PREFILLED_SYRINGE | INTRAVENOUS | Status: AC
  Filled 2018-01-12: qty 10

## 2018-01-12 MED ORDER — LIDOCAINE 2% (20 MG/ML) 5 ML SYRINGE
INTRAMUSCULAR | Status: AC
  Filled 2018-01-12: qty 5

## 2018-01-12 MED ORDER — EPHEDRINE 5 MG/ML INJ
INTRAVENOUS | Status: AC
  Filled 2018-01-12: qty 10

## 2018-01-12 MED ORDER — CEFAZOLIN SODIUM-DEXTROSE 2-4 GM/100ML-% IV SOLN
2.0000 g | Freq: Once | INTRAVENOUS | Status: AC
  Administered 2018-01-12: 2 g via INTRAVENOUS
  Filled 2018-01-12: qty 100

## 2018-01-12 MED ORDER — EPHEDRINE SULFATE-NACL 50-0.9 MG/10ML-% IV SOSY
PREFILLED_SYRINGE | INTRAVENOUS | Status: DC | PRN
  Administered 2018-01-12: 10 mg via INTRAVENOUS

## 2018-01-12 MED ORDER — TRAMADOL HCL 50 MG PO TABS
50.0000 mg | ORAL_TABLET | Freq: Four times a day (QID) | ORAL | Status: DC | PRN
  Administered 2018-01-13: 100 mg via ORAL
  Administered 2018-01-13: 50 mg via ORAL
  Administered 2018-01-13 – 2018-01-14 (×2): 100 mg via ORAL
  Filled 2018-01-12: qty 1
  Filled 2018-01-12 (×3): qty 2

## 2018-01-12 MED ORDER — BUPIVACAINE LIPOSOME 1.3 % IJ SUSP
20.0000 mL | Freq: Once | INTRAMUSCULAR | Status: AC
  Administered 2018-01-12: 20 mL
  Filled 2018-01-12: qty 20

## 2018-01-12 MED ORDER — SULFAMETHOXAZOLE-TRIMETHOPRIM 800-160 MG PO TABS: 1.0000 | ORAL_TABLET | Freq: Two times a day (BID) | ORAL | 0 refills | Status: DC

## 2018-01-12 MED ORDER — ALPRAZOLAM 0.25 MG PO TABS
0.2500 mg | ORAL_TABLET | Freq: Two times a day (BID) | ORAL | Status: DC | PRN
  Administered 2018-01-13: 0.25 mg via ORAL
  Filled 2018-01-12: qty 1

## 2018-01-12 MED ORDER — FENTANYL CITRATE (PF) 100 MCG/2ML IJ SOLN
25.0000 ug | INTRAMUSCULAR | Status: DC | PRN
  Administered 2018-01-12 (×2): 25 ug via INTRAVENOUS

## 2018-01-12 MED ORDER — DIPHENHYDRAMINE HCL 12.5 MG/5ML PO ELIX: 12.5000 mg | ORAL_SOLUTION | Freq: Four times a day (QID) | ORAL | Status: DC | PRN

## 2018-01-12 MED ORDER — PROPOFOL 10 MG/ML IV BOLUS
INTRAVENOUS | Status: AC
  Filled 2018-01-12: qty 20

## 2018-01-12 MED ORDER — LACTATED RINGERS IV SOLN: INTRAVENOUS | Status: DC

## 2018-01-12 MED ORDER — SODIUM CHLORIDE 0.9 % IV BOLUS
1000.0000 mL | Freq: Once | INTRAVENOUS | Status: AC
  Administered 2018-01-12: 1000 mL via INTRAVENOUS

## 2018-01-12 MED ORDER — PROPOFOL 10 MG/ML IV BOLUS
INTRAVENOUS | Status: DC | PRN
  Administered 2018-01-12: 50 mg via INTRAVENOUS
  Administered 2018-01-12: 150 mg via INTRAVENOUS

## 2018-01-12 MED ORDER — ONDANSETRON HCL 4 MG/2ML IJ SOLN
4.0000 mg | INTRAMUSCULAR | Status: DC | PRN
  Administered 2018-01-13: 4 mg via INTRAVENOUS
  Filled 2018-01-12: qty 2

## 2018-01-12 MED ORDER — SODIUM CHLORIDE 0.9 % IJ SOLN
INTRAMUSCULAR | Status: AC
  Filled 2018-01-12: qty 20

## 2018-01-12 MED ORDER — TRAMADOL HCL 50 MG PO TABS: 50.0000 mg | ORAL_TABLET | Freq: Four times a day (QID) | ORAL | 0 refills | Status: DC | PRN

## 2018-01-12 SURGICAL SUPPLY — 56 items
APPLICATOR COTTON TIP 6 STRL (MISCELLANEOUS) ×1 IMPLANT
APPLICATOR COTTON TIP 6IN STRL (MISCELLANEOUS) ×2
CATH FOLEY 2WAY SLVR 18FR 30CC (CATHETERS) ×2 IMPLANT
CATH TIEMANN FOLEY 18FR 5CC (CATHETERS) ×2 IMPLANT
CHLORAPREP W/TINT 26ML (MISCELLANEOUS) ×2 IMPLANT
CLIP VESOLOCK LG 6/CT PURPLE (CLIP) ×4 IMPLANT
COVER SURGICAL LIGHT HANDLE (MISCELLANEOUS) ×2 IMPLANT
COVER TIP SHEARS 8 DVNC (MISCELLANEOUS) ×2 IMPLANT
COVER TIP SHEARS 8MM DA VINCI (MISCELLANEOUS) ×2
COVER WAND RF STERILE (DRAPES) IMPLANT
CUTTER ECHEON FLEX ENDO 45 340 (ENDOMECHANICALS) ×2 IMPLANT
DECANTER SPIKE VIAL GLASS SM (MISCELLANEOUS) ×2 IMPLANT
DERMABOND ADVANCED (GAUZE/BANDAGES/DRESSINGS) ×1
DERMABOND ADVANCED .7 DNX12 (GAUZE/BANDAGES/DRESSINGS) ×1 IMPLANT
DRAPE ARM DVNC X/XI (DISPOSABLE) ×4 IMPLANT
DRAPE COLUMN DVNC XI (DISPOSABLE) ×1 IMPLANT
DRAPE DA VINCI XI ARM (DISPOSABLE) ×4
DRAPE DA VINCI XI COLUMN (DISPOSABLE) ×1
DRAPE SURG IRRIG POUCH 19X23 (DRAPES) ×2 IMPLANT
DRSG TEGADERM 4X4.75 (GAUZE/BANDAGES/DRESSINGS) ×2 IMPLANT
ELECT REM PT RETURN 15FT ADLT (MISCELLANEOUS) ×2 IMPLANT
GAUZE SPONGE 2X2 8PLY STRL LF (GAUZE/BANDAGES/DRESSINGS) IMPLANT
GLOVE BIO SURGEON STRL SZ 6.5 (GLOVE) ×2 IMPLANT
GLOVE BIOGEL M STRL SZ7.5 (GLOVE) ×4 IMPLANT
GOWN STRL REUS W/TWL LRG LVL3 (GOWN DISPOSABLE) ×2 IMPLANT
GOWN STRL REUS W/TWL XL LVL3 (GOWN DISPOSABLE) ×4 IMPLANT
HOLDER FOLEY CATH W/STRAP (MISCELLANEOUS) ×2 IMPLANT
IRRIG SUCT STRYKERFLOW 2 WTIP (MISCELLANEOUS) ×2
IRRIGATION SUCT STRKRFLW 2 WTP (MISCELLANEOUS) ×1 IMPLANT
IV LACTATED RINGERS 1000ML (IV SOLUTION) IMPLANT
PACK ROBOT UROLOGY CUSTOM (CUSTOM PROCEDURE TRAY) ×2 IMPLANT
PAD POSITIONING PINK XL (MISCELLANEOUS) ×2 IMPLANT
PORT ACCESS TROCAR AIRSEAL 12 (TROCAR) ×1 IMPLANT
PORT ACCESS TROCAR AIRSEAL 5M (TROCAR) ×1
SCISSORS LAP 5X45 EPIX DISP (ENDOMECHANICALS) ×2 IMPLANT
SEAL CANN UNIV 5-8 DVNC XI (MISCELLANEOUS) ×4 IMPLANT
SEAL XI 5MM-8MM UNIVERSAL (MISCELLANEOUS) ×4
SET TRI-LUMEN FLTR TB AIRSEAL (TUBING) ×2 IMPLANT
SOLUTION ELECTROLUBE (MISCELLANEOUS) ×2 IMPLANT
SPONGE DRAIN TRACH 4X4 STRL 2S (GAUZE/BANDAGES/DRESSINGS) ×2 IMPLANT
SPONGE GAUZE 2X2 STER 10/PKG (GAUZE/BANDAGES/DRESSINGS)
STAPLE RELOAD 45 GRN (STAPLE) ×1 IMPLANT
STAPLE RELOAD 45MM GREEN (STAPLE) ×1
SUT ETHILON 3 0 PS 1 (SUTURE) ×2 IMPLANT
SUT MNCRL AB 4-0 PS2 18 (SUTURE) ×4 IMPLANT
SUT V-LOC BARB 180 2/0GR6 GS22 (SUTURE) ×2
SUT VIC AB 0 CT1 27 (SUTURE) ×1
SUT VIC AB 0 CT1 27XBRD ANTBC (SUTURE) ×1 IMPLANT
SUT VIC AB 2-0 SH 27 (SUTURE) ×1
SUT VIC AB 2-0 SH 27X BRD (SUTURE) ×1 IMPLANT
SUT VICRYL 0 UR6 27IN ABS (SUTURE) ×4 IMPLANT
SUT VLOC BARB 180 ABS3/0GR12 (SUTURE) ×4
SUTURE V-LC BRB 180 2/0GR6GS22 (SUTURE) ×1 IMPLANT
SUTURE VLOC BRB 180 ABS3/0GR12 (SUTURE) ×2 IMPLANT
TOWEL OR NON WOVEN STRL DISP B (DISPOSABLE) ×2 IMPLANT
WATER STERILE IRR 1000ML POUR (IV SOLUTION) ×2 IMPLANT

## 2018-01-12 NOTE — Anesthesia Procedure Notes (Signed)
Procedure Name: Intubation Date/Time: 01/12/2018 4:23 PM Performed by: Talbot Grumbling, CRNA Pre-anesthesia Checklist: Patient identified, Emergency Drugs available and Patient being monitored Patient Re-evaluated:Patient Re-evaluated prior to induction Oxygen Delivery Method: Circle system utilized Preoxygenation: Pre-oxygenation with 100% oxygen Induction Type: IV induction Ventilation: Mask ventilation without difficulty Laryngoscope Size: Mac and 3 Grade View: Grade I Tube type: Oral Tube size: 7.5 mm Number of attempts: 1 Airway Equipment and Method: Stylet Placement Confirmation: ETT inserted through vocal cords under direct vision,  positive ETCO2 and breath sounds checked- equal and bilateral Secured at: 22 cm Tube secured with: Tape Dental Injury: Teeth and Oropharynx as per pre-operative assessment

## 2018-01-12 NOTE — Discharge Instructions (Signed)

## 2018-01-12 NOTE — Anesthesia Postprocedure Evaluation (Signed)
Anesthesia Post Note  Patient: Jeff Alexander  Procedure(s) Performed: XI ROBOTIC ASSISTED LAPAROSCOPIC RADICAL PROSTATECTOMY (N/A Abdomen) PELVIC LYMPH NODE DISSECTION, EXTENDED (N/A Abdomen)     Patient location during evaluation: PACU Anesthesia Type: General Level of consciousness: awake and alert Pain management: pain level controlled Vital Signs Assessment: post-procedure vital signs reviewed and stable Respiratory status: spontaneous breathing, nonlabored ventilation, respiratory function stable and patient connected to nasal cannula oxygen Cardiovascular status: blood pressure returned to baseline and stable Postop Assessment: no apparent nausea or vomiting Anesthetic complications: no    Last Vitals:  Vitals:   01/12/18 2100 01/12/18 2124  BP: (!) 146/85 136/82  Pulse: 93 89  Resp: 12 14  Temp: 36.9 C 37.1 C  SpO2: 100% 100%    Last Pain:  Vitals:   01/12/18 2128  TempSrc:   PainSc: 3                  May Manrique P Preslynn Bier

## 2018-01-12 NOTE — Op Note (Addendum)
Preoperative diagnosis:  1. Prostate Cancer   Postoperative diagnosis:  1. same   Procedure: 1. Robotic assisted laparoscopic radical prostatectomy 2. Bilateral extended pelvic lymph node dissection  Surgeon: Ardis Hughs, MD First Assistant: Debbrah Alar, PA Resident Assistant: Dr. Basilio Cairo, MD  Anesthesia: General  Complications: None  Intraoperative findings: non-nerve sparing, no gross evidence of metastatic disease, water tight urethro-vesical anastomosis.  EBL: Minimal  Specimens:  #1.  Prostate and seminal vesicals #2.  Bilateral pelvic lymph nodes  Indication: Jeff Alexander is a 57 y.o. patient with prostate cancer.  After reviewing the management options for treatment, he elected to proceed with the removal of his prostate. We have discussed the potential benefits and risks of the procedure, side effects of the proposed treatment, the likelihood of the patient achieving the goals of the procedure, and any potential problems that might occur during the procedure or recuperation. Informed consent has been obtained.  Description of procedure:  The patient was consented in the preoperative holding area. He was in brought back to the operating room placed the table in supine position. General anesthesia was then induced and endotracheal tube was inserted. He was then placed in dorsolithotomy position and placed in steep Trendelenburg. He was then prepped and draped in the routine sterile fashion. We, the first assistant and I, then began by making a 10 mm incision supraumbilical midline incision the skin down through into the peritoneum. Then placed a 8 mm trocar. I then inflated the abdomen and inserted the 0 robotic lens. We then placed 2 additional a 8 millimeter trochars in the patient's left lower abdomen proximally 9 cm apart and 2 trochars on the patient's right lower abdomen, one was an 8 mm trocar and the one most lateral was a 12 mm trocar which was used as  the assistant port. A 5 mm trocar was placed by triangulating the 2 right lateral ports as a second assistant port. These ports were all placed under visual guidance. Once the ports were noted to be satisfactory position the robot was docked. We started with the 0 lens, monopolar scissors in the right hand and the [place] forceps the left hand as well as a fenestrated grasper as the third arm on the left-hand side.   We, the first assistant and I,  began our dissection the posterior plane incising the peritoneum at the level of the vas deferens. Isolated the left vas deferens and dissected it proximally towards the spermatic cord for 5 cm prior to ligating it. Then used this as traction to isolate the left the seminal vesicle which was then undressed bluntly and completely dissected out, all vessels were cauterized with a combination of bipolar and the monopolar scissors. We then turned our attention to the right side and similarly dissected out the right vas deferens and seminal vesicle. Once the SVs had been freed, we turned our attention to the posterior plane and bluntly dissected the tissue between the rectum and the posterior wall of the prostate bluntly out towards the apex.    At this point the bladder was taken down starting at the urachal remnant with a combination of both blunt dissection and sharp dissection using monopolar cautery the bladder was dropped down in the usual fashion to the medial umbilical ligaments laterally and the dorsal vein of the prostate anteriorly creating our space of Retzius. We then turned our attention to the endopelvic fascia which was incised laterally starting on the patient's right-hand side the levator muscles  were pushed off the prostate laterally up towards the dorsal vein complex on the right-hand side. This process was then repeated on the left-hand side and a nice notch was created for the dorsal vein. I then used a 87mm stapler to staple the dorsal vein.    We,the first assistant and I, then located the bladder neck at the vesicoprostatic junction and using the monopolar scissors dissected down through the perivesical tissues and the bladder neck down to the prostatic urethra. The catheter was then deflated and pulled through our urethral opening and then used to retract the prostate anteriorly for the posterior bladder neck dissection. Once through the bladder neck and into the posterior plane of the prostate, the SVs were brought through the opening. The left pedicle was then isolated and systematically ligated with Weck clips and scissors.   I then came down through the dorsal venous complex anteriorly down to the membranous urethra using the monopolar. Once down to the urethra, it was transected sharply and the apex of the prostate was then dissected off the levator and rectourethralis muscles. Once the apex of the prostate had been dissected free we came back to the base of the prostate and bluntly push the rectum and nerve vascular bundle off the prostate the patient's left and used clips on the patient's right to free the prostate. Once the prostate was free it was placed off to the side. The pelvis was then irrigated with normal saline and noted to be relatively hemostatic.  Attention was then turned to the right pelvic sidewall. The fibrofatty tissue between the external iliac vein, confluence of the iliac vessels, hypogastric artery, and Cooper's ligament was dissected free from the pelvic sidewall with care to preserve the obturator nerve. Weck clips were used for lymphostasis and hemostasis. An identical procedure was performed on the contralateral side and the lymphatic packets were removed for permanent pathologic analysis.  The prostate and both lymph node tissues were placed in the Endo Catch bag and the string brought to the 5 mm port.    The vesicourethral anastomosis was then completed with 2 interlocking 3-0 V. lock sutures running the  anastomosis in the 6:00 position to the 12:00 position on each side and then tying it off on the top. The final catheter was then passed through the patient's urethra and into the bladder and 120 cc was instilled into the bladder to test the anastomosis. As there was no leak a 85 [place] Blake drain was passed through the left lateral port and placed around the vesicourethral anastomosis. A 12 mm assistant port on the right lateral side was then closed with 0 Vicryl with the help of the [employer] needle. The 12 mm midline infraumbilical incision was then extended another centimeter taken down and the fascia opened to remove the Endo Catch bag with the prostate specimen. The fascia was then closed with a 0 Vicryl and all skin ports were closed with 4-0 Monocryl in a subcutaneous fashion. Dermabond glue was then applied to the incisions. The drain was then secured to the skin with a 0 nylon stitch and dressing applied.   At the end of the case all laps needles and sponges had been accounted for. There no immediate complications. The patient returned to the PACU in stable condition.

## 2018-01-12 NOTE — H&P (Signed)
The patient presents today for follow-up of his prostatitis. His voiding symptoms have improved somewhat but are not quite back to baseline. He has not had any fevers. He denies any pain with urination.   The patient's PSA was 111.   The patient has no family history of prostate cancer.   The patient's rectal exam was concerning 1 month ago, because it was firm, and nontender.    The patient is otherwise healthy, takes no medications except for allergy medication once a while. The patient is fully potent with excellent erectile function.   Interval: The patient presents today after follow-up from his prostate biopsy. He tolerated his biopsy well without any significant complications. He still has difficulty voiding, but the biopsy did not worsen this. Today we discussed doubling and the tamsulosin as well as trying ibuprofen 800 mg to see if this helped with his symptoms.   The patient presents today with a friend. He has reviewed the prostate cancer information booklet as well as his biopsy prior.   Prostate cancer profile  Stage: T2c - DRE revealed a very firm irregular prostate centrally involving both lobes.  PSA: 111  Biopsy on 11/28/17, 11/13 cores positive: The patient had 1 negative biopsy of the right seminal vesicle. He had positive cores of Gleason 4+3 = 7 as well has Gleason 4+4 = 8 and Gleason 4+5 = 9 in the left medial base.  Prostate volume: 43 g, the prostate was inhomogeneous  Pelvic CT on 12/03/17: This demonstrated no evidence of metastatic disease including no pelvic lymphadenopathy.     ALLERGIES: codeine    MEDICATIONS: Mobic 15 mg tablet 1 tablet PO Daily  Tamsulosin Hcl 0.4 mg capsule 1 capsule PO Daily  Loratadine 10 mg tablet     GU PSH: Prostate Needle Biopsy - 11/28/2017    NON-GU PSH: Surgical Pathology, Gross And Microscopic Examination For Prostate Needle - 11/28/2017    GU PMH: Elevated PSA - 11/24/2017 Prostate nodule w/ LUTS - 10/13/2017 Urinary  Frequency - 10/13/2017 Urinary Urgency - 10/13/2017    NON-GU PMH: No Non-GU PMH    FAMILY HISTORY: Diabetes - Sister   SOCIAL HISTORY: Marital Status: Single Preferred Language: English; Ethnicity: Not Hispanic Or Latino; Race: White Current Smoking Status: Patient has never smoked.   Tobacco Use Assessment Completed: Used Tobacco in last 30 days? Social Drinker.  Drinks 2 caffeinated drinks per day.    REVIEW OF SYSTEMS:    GU Review Male:   Patient denies frequent urination, hard to postpone urination, burning/ pain with urination, get up at night to urinate, leakage of urine, stream starts and stops, trouble starting your stream, have to strain to urinate , erection problems, and penile pain.  Gastrointestinal (Upper):   Patient denies nausea, vomiting, and indigestion/ heartburn.  Gastrointestinal (Lower):   Patient denies diarrhea and constipation.  Constitutional:   Patient denies fever, night sweats, weight loss, and fatigue.  Skin:   Patient denies skin rash/ lesion and itching.  Eyes:   Patient denies blurred vision and double vision.  Ears/ Nose/ Throat:   Patient denies sore throat and sinus problems.  Hematologic/Lymphatic:   Patient denies swollen glands and easy bruising.  Cardiovascular:   Patient denies leg swelling and chest pains.  Respiratory:   Patient denies cough and shortness of breath.  Endocrine:   Patient denies excessive thirst.  Musculoskeletal:   Patient denies back pain and joint pain.  Neurological:   Patient denies headaches and dizziness.  Psychologic:  Patient denies depression and anxiety.   VITAL SIGNS: None  Notes: No vitals taken today   MULTI-SYSTEM PHYSICAL EXAMINATION:    Constitutional: Well-nourished. No physical deformities. Normally developed. Good grooming.  Neck: Neck symmetrical, not swollen. Normal tracheal position.  Respiratory: No labored breathing, no use of accessory muscles. Normal breath sounds.  Cardiovascular: Regular  rate and rhythm. No murmur, no gallop. Normal temperature, normal extremity pulses, no swelling, no varicosities.  Lymphatic: No enlargement of neck, axillae, groin.  Skin: No paleness, no jaundice, no cyanosis. No lesion, no ulcer, no rash.  Neurologic / Psychiatric: Oriented to time, oriented to place, oriented to person. No depression, no anxiety, no agitation.  Gastrointestinal: No mass, no tenderness, no rigidity, non obese abdomen.  Eyes: Normal conjunctivae. Normal eyelids.  Ears, Nose, Mouth, and Throat: Left ear no scars, no lesions, no masses. Right ear no scars, no lesions, no masses. Nose no scars, no lesions, no masses. Normal hearing. Normal lips.  Musculoskeletal: Normal gait and station of head and neck.     PAST DATA REVIEWED:  Source Of History:  Patient  Lab Test Review:   PSA  Records Review:   Pathology Reports, Previous Doctor Records, Previous Patient Records, POC Tool  X-Ray Review: C.T. Pelvis: Reviewed Films. Discussed With Patient. No evidence of metastatic disease    11/16/17  PSA  Total PSA 111.00 ng/mL    PROCEDURES: None   ASSESSMENT:      ICD-10 Details  1 GU:   Prostate Cancer - C61    PLAN:           Document Letter(s):  Created for Patient: Clinical Summary         Notes:   Visit very unfortunate 57 year old who is otherwise healthy and diagnosed with high-grade prostate cancer. He was initially seen for prostatitis and was treated for a month with anti-inflammatory and tamsulosin. His symptoms did improve somewhat, but his PSA at that time was 111 and he had an irregular exam. Surgical he had a prostate biopsy demonstrating Gleason 4+5 = 9 cancer. Fortunately so far as metastatic evaluation is been negative including a pelvic CT scan. His bone scan is pending.   I discussed the severity of his prostate cancer, and expressed my concern for him in his well-being. We discussed his treatment options, albeit we did not have all the requisite  information to make any definitive treatment decisions. The patient's bone scan is pending for next Monday which is in 6 days. We did discuss treatment options as if there was no evidence of metastatic disease versus evidence of metastatic disease. We discussed both external beam radiation with androgen deprivation and surgery as the primary modality of therapy. We discussed the risk and the benefits of each one. The patient understands that he is likely going to need multimodality therapy given his high-risk disease. Given this, I think the patient might be best served with surgery. I briefly described the operation for the patient including the risks and the associated side effects. We discussed performing a non-nerve sparing radical prostatectomy with an extended lymph node dissection. He understands the risks of rectal dysfunction as well as incontinence. We also discussed radiation and the associated benefits of radiation as well as the risks associated with it. Ultimately, I did recommended the patient consider a consultation with the multidisciplinary high-risk prostate cancer group at the [facility]. We'll try to get this appointment scheduled for the patient as soon as possible. We will keep  in touch with the results of his bone scan. I provided the patient with additional prostate cancer information and resources. We answered all his questions. I will keep in touch with them.   CC: Jill Alexanders, M.D.

## 2018-01-12 NOTE — Transfer of Care (Signed)
Immediate Anesthesia Transfer of Care Note  Patient: Jeff Alexander  Procedure(s) Performed: XI ROBOTIC ASSISTED LAPAROSCOPIC RADICAL PROSTATECTOMY (N/A Abdomen) PELVIC LYMPH NODE DISSECTION, EXTENDED (N/A Abdomen)  Patient Location: PACU  Anesthesia Type:General  Level of Consciousness: awake, alert  and oriented  Airway & Oxygen Therapy: Patient Spontanous Breathing and Patient connected to face mask oxygen  Post-op Assessment: Report given to RN and Post -op Vital signs reviewed and stable  Post vital signs: Reviewed and stable  Last Vitals:  Vitals Value Taken Time  BP    Temp    Pulse 104 01/12/2018  8:12 PM  Resp 16 01/12/2018  8:12 PM  SpO2 96 % 01/12/2018  8:12 PM  Vitals shown include unvalidated device data.  Last Pain:  Vitals:   01/12/18 1220  TempSrc: Oral      Patients Stated Pain Goal: 3 (76/22/63 3354)  Complications: No apparent anesthesia complications

## 2018-01-12 NOTE — Anesthesia Preprocedure Evaluation (Signed)
Anesthesia Evaluation  Patient identified by MRN, date of birth, ID band Patient awake    Reviewed: Allergy & Precautions, NPO status , Patient's Chart, lab work & pertinent test results  Airway Mallampati: II  TM Distance: >3 FB Neck ROM: Full    Dental no notable dental hx.    Pulmonary neg pulmonary ROS,    Pulmonary exam normal breath sounds clear to auscultation       Cardiovascular negative cardio ROS Normal cardiovascular exam Rhythm:Regular Rate:Normal     Neuro/Psych negative neurological ROS  negative psych ROS   GI/Hepatic negative GI ROS, Neg liver ROS,   Endo/Other  negative endocrine ROS  Renal/GU negative Renal ROS  negative genitourinary   Musculoskeletal negative musculoskeletal ROS (+)   Abdominal   Peds negative pediatric ROS (+)  Hematology negative hematology ROS (+)   Anesthesia Other Findings   Reproductive/Obstetrics negative OB ROS                             Anesthesia Physical Anesthesia Plan  ASA: II  Anesthesia Plan: General   Post-op Pain Management:    Induction: Intravenous  PONV Risk Score and Plan: 2 and Treatment may vary due to age or medical condition and Ondansetron  Airway Management Planned: Oral ETT  Additional Equipment:   Intra-op Plan:   Post-operative Plan: Extubation in OR  Informed Consent: I have reviewed the patients History and Physical, chart, labs and discussed the procedure including the risks, benefits and alternatives for the proposed anesthesia with the patient or authorized representative who has indicated his/her understanding and acceptance.   Dental advisory given  Plan Discussed with: CRNA  Anesthesia Plan Comments:         Anesthesia Quick Evaluation

## 2018-01-13 ENCOUNTER — Encounter (HOSPITAL_COMMUNITY): Payer: Self-pay | Admitting: Urology

## 2018-01-13 ENCOUNTER — Other Ambulatory Visit: Payer: Self-pay

## 2018-01-13 DIAGNOSIS — C7951 Secondary malignant neoplasm of bone: Secondary | ICD-10-CM | POA: Diagnosis not present

## 2018-01-13 DIAGNOSIS — Z885 Allergy status to narcotic agent status: Secondary | ICD-10-CM | POA: Diagnosis not present

## 2018-01-13 DIAGNOSIS — Z79899 Other long term (current) drug therapy: Secondary | ICD-10-CM | POA: Diagnosis not present

## 2018-01-13 DIAGNOSIS — C61 Malignant neoplasm of prostate: Secondary | ICD-10-CM | POA: Diagnosis not present

## 2018-01-13 LAB — BASIC METABOLIC PANEL
ANION GAP: 9 (ref 5–15)
BUN: 14 mg/dL (ref 6–20)
CO2: 22 mmol/L (ref 22–32)
Calcium: 7.9 mg/dL — ABNORMAL LOW (ref 8.9–10.3)
Chloride: 102 mmol/L (ref 98–111)
Creatinine, Ser: 0.85 mg/dL (ref 0.61–1.24)
GFR calc non Af Amer: >60 mL/min (ref >60)
GLUCOSE: 171 mg/dL — AB (ref 70–99)
POTASSIUM: 4 mmol/L (ref 3.5–5.1)
Sodium: 133 mmol/L — ABNORMAL LOW (ref 135–145)

## 2018-01-13 LAB — GLUCOSE, CAPILLARY: GLUCOSE-CAPILLARY: 164 mg/dL — AB (ref 70–99)

## 2018-01-13 LAB — HEMOGLOBIN AND HEMATOCRIT, BLOOD
HEMATOCRIT: 38.6 % — AB (ref 39.0–52.0)
Hemoglobin: 12.9 g/dL — ABNORMAL LOW (ref 13.0–17.0)

## 2018-01-13 MED ORDER — ACETAMINOPHEN 325 MG PO TABS
650.0000 mg | ORAL_TABLET | ORAL | Status: DC | PRN
  Administered 2018-01-13 – 2018-01-14 (×3): 650 mg via ORAL
  Filled 2018-01-13 (×3): qty 2

## 2018-01-13 NOTE — Progress Notes (Signed)
Upon assisting pt to dangle legs on side of bed, Pt stated that he felt  nauseous and began to shiver. This RN left the room to obtain nausea medication leaving the Pt with the NT. The pt told the NT that, "he was about to be sick". The NT grabbed the trash can and placed it close to the pt as the patient bent over the trash can. At this time, per NT, the pt became limp and collapsed in her arms while sitting on the side of bed. The NT assisted patient back to sitting position and yelled out for this RN. Pt did not stand up or leave the bed. Upon entering the room the patient began to open his eyes and sit up on this own. New set of vitals obtained, CBG found to be 164. Pt stated shortly after nausea medication given that his nausea had subsided. Will continue to monitor closely.

## 2018-01-13 NOTE — Progress Notes (Signed)
1 Day Post-Op Subjective: Patient reports mild abdominal pain. Urine clear. Negative flatus  Objective: Vital signs in last 24 hours: Temp:  [97.4 F (36.3 C)-98.8 F (37.1 C)] 97.5 F (36.4 C) (10/19 1446) Pulse Rate:  [76-109] 76 (10/19 1446) Resp:  [10-17] 16 (10/19 1446) BP: (113-159)/(74-99) 113/76 (10/19 1446) SpO2:  [95 %-100 %] 98 % (10/19 1446)  Intake/Output from previous day: 10/18 0701 - 10/19 0700 In: 4241.3 [I.V.:3141.3; IV Piggyback:1100] Out: 3350 [Urine:2900; Drains:50; Blood:400] Intake/Output this shift: Total I/O In: 800 [I.V.:600; IV Piggyback:200] Out: 700 [Urine:600; Drains:100]  Physical Exam:  General:alert and cooperative GI: soft and tenderness: suprapubic Male genitalia: not done Extremities: extremities normal, atraumatic, no cyanosis or edema  Lab Results: Recent Labs    01/12/18 2022 01/13/18 0524  HGB 13.8 12.9*  HCT 42.6 38.6*   BMET Recent Labs    01/13/18 0524  NA 133*  K 4.0  CL 102  CO2 22  GLUCOSE 171*  BUN 14  CREATININE 0.85  CALCIUM 7.9*   No results for input(s): LABPT, INR in the last 72 hours. No results for input(s): LABURIN in the last 72 hours. Results for orders placed or performed during the hospital encounter of 01/09/18  Urine culture     Status: None   Collection Time: 01/09/18 11:30 AM  Result Value Ref Range Status   Specimen Description   Final    URINE, CLEAN CATCH Performed at [facility], [place] [address] [place], [place] [address]    Special Requests   Final    NONE Performed at [facility], [place] [address] [place], [place] [address]    Culture   Final    NO GROWTH Performed at [facility], [place] [address] [place], [place] [address]    Report Status 01/11/2018 FINAL  Final    Studies/Results: No results found.  Assessment/Plan: POD#1 robotic radical prostatectomy  1. Continue clear liquid diet 2. Ambulate in halls with assistance 3.  Continue current pain control regiment   LOS: 0 days   Nicolette Bang 01/13/2018, 3:42 PM

## 2018-01-14 ENCOUNTER — Encounter (HOSPITAL_COMMUNITY): Payer: Self-pay

## 2018-01-14 DIAGNOSIS — Z885 Allergy status to narcotic agent status: Secondary | ICD-10-CM | POA: Diagnosis not present

## 2018-01-14 DIAGNOSIS — C7951 Secondary malignant neoplasm of bone: Secondary | ICD-10-CM | POA: Diagnosis not present

## 2018-01-14 DIAGNOSIS — C61 Malignant neoplasm of prostate: Secondary | ICD-10-CM | POA: Diagnosis not present

## 2018-01-14 DIAGNOSIS — Z79899 Other long term (current) drug therapy: Secondary | ICD-10-CM | POA: Diagnosis not present

## 2018-01-15 NOTE — Discharge Summary (Signed)
Physician Discharge Summary  Patient ID: Jeff Alexander MRN: [id number] DOB/AGE: 08/04/1960 57 y.o.  Admit date: 01/12/2018 Discharge date: 01/14/2018  Admission Diagnoses: Prostate cancer Discharge Diagnoses:  Active Problems:   Prostate cancer [facility])   Discharged Condition: good  Hospital Course: The patient tolerated the procedure well and was transferred to the floor on IV pain meds, IV fluid. On POD#1 pt was started on clear liquid diet and they ambulated in the halls. On POD#2 the patient was transitioned to a regular diet, IVFs were discontinued, and the patient passed flatus. Prior to discharge the pt was tolerating a regular diet, pain was controlled on PO pain meds, they were ambulating without difficulty, and they had normal bowel function.  Consults: none  Significant Diagnostic Studies: none  Treatments: surgery: robotic radical prostatectomy  Discharge Exam: Blood pressure 127/78, pulse 80, temperature 99.5 F (37.5 C), temperature source Oral, resp. rate 16, height 5\' 6"  (1.676 m), weight 68.1 kg, SpO2 94 %. General appearance: alert, cooperative and appears stated age Head: Normocephalic, without obvious abnormality, atraumatic Nose: Nares normal. Septum midline. Mucosa normal. No drainage or sinus tenderness. Neck: no adenopathy, no carotid bruit, no JVD, supple, symmetrical, trachea midline and thyroid not enlarged, symmetric, no tenderness/mass/nodules Resp: clear to auscultation bilaterally Cardio: regular rate and rhythm, S1, S2 normal, no murmur, click, rub or gallop GI: soft, non-tender; bowel sounds normal; no masses,  no organomegaly Extremities: extremities normal, atraumatic, no cyanosis or edema Neurologic: Grossly normal  Disposition: Discharge disposition: 01-Home or Self Care       Discharge Instructions    Discharge patient   Complete by:  As directed    Discharge disposition:  01-Home or Self Care   Discharge patient date:  01/14/2018     Allergies as of 01/14/2018      Reactions   Codeine Nausea And Vomiting      Medication List    STOP taking these medications   ibuprofen 200 MG tablet Commonly known as:  ADVIL,MOTRIN   tamsulosin 0.4 MG Caps capsule Commonly known as:  FLOMAX     TAKE these medications   ALPRAZolam 0.25 MG tablet Commonly known as:  XANAX Take 1 tablet (0.25 mg total) by mouth 2 (two) times daily as needed for anxiety.   loratadine 10 MG tablet Commonly known as:  CLARITIN Take 10 mg by mouth daily as needed for allergies.   sulfamethoxazole-trimethoprim 800-160 MG tablet Commonly known as:  BACTRIM DS,SEPTRA DS Take 1 tablet by mouth 2 (two) times daily. Start the day prior to foley removal appointment   traMADol 50 MG tablet Commonly known as:  ULTRAM Take 1-2 tablets (50-100 mg total) by mouth every 6 (six) hours as needed for moderate pain or severe pain.      Follow-up Information    Ardis Hughs, MD Follow up on 01/22/2018.   Specialty:  Urology Why:  at 8:00 Contact information: [place] [address] [phone]           Signed: Nicolette Bang 01/15/2018, 10:43 AM

## 2018-01-15 NOTE — Telephone Encounter (Signed)
[facility] is requesting to fill pt xanax. Please advise [facility] 

## 2018-01-16 ENCOUNTER — Telehealth: Payer: Self-pay | Admitting: Medical Oncology

## 2018-01-16 NOTE — Telephone Encounter (Signed)
Jeff Alexander states he is doing well post robotic prostatectomy. He has discomfort but no pain. He is very relieved to have this behind him and states the surgery was much better than he had anticipated. He has follow up with Dr. Louis Meckel 10/28 for catheter removal and discussion of pathology report. I asked him to call me with questions or concerns. He voiced understanding.

## 2018-02-02 ENCOUNTER — Other Ambulatory Visit: Payer: Self-pay | Admitting: Family Medicine

## 2018-02-02 NOTE — Telephone Encounter (Signed)
Is this okay to refill? 

## 2018-02-09 DIAGNOSIS — N393 Stress incontinence (female) (male): Secondary | ICD-10-CM | POA: Diagnosis not present

## 2018-02-09 DIAGNOSIS — M6281 Muscle weakness (generalized): Secondary | ICD-10-CM | POA: Diagnosis not present

## 2018-02-09 DIAGNOSIS — M62838 Other muscle spasm: Secondary | ICD-10-CM | POA: Diagnosis not present

## 2018-02-16 ENCOUNTER — Other Ambulatory Visit (INDEPENDENT_AMBULATORY_CARE_PROVIDER_SITE_OTHER): Payer: BLUE CROSS/BLUE SHIELD

## 2018-02-16 DIAGNOSIS — Z23 Encounter for immunization: Secondary | ICD-10-CM | POA: Diagnosis not present

## 2018-02-26 DIAGNOSIS — N393 Stress incontinence (female) (male): Secondary | ICD-10-CM | POA: Diagnosis not present

## 2018-02-26 DIAGNOSIS — M6281 Muscle weakness (generalized): Secondary | ICD-10-CM | POA: Diagnosis not present

## 2018-02-26 DIAGNOSIS — M62838 Other muscle spasm: Secondary | ICD-10-CM | POA: Diagnosis not present

## 2018-02-26 DIAGNOSIS — C61 Malignant neoplasm of prostate: Secondary | ICD-10-CM | POA: Diagnosis not present

## 2018-03-05 DIAGNOSIS — R9721 Rising PSA following treatment for malignant neoplasm of prostate: Secondary | ICD-10-CM | POA: Diagnosis not present

## 2018-03-13 DIAGNOSIS — N5231 Erectile dysfunction following radical prostatectomy: Secondary | ICD-10-CM | POA: Diagnosis not present

## 2018-03-26 ENCOUNTER — Other Ambulatory Visit (HOSPITAL_COMMUNITY): Payer: Self-pay | Admitting: Urology

## 2018-03-26 DIAGNOSIS — R590 Localized enlarged lymph nodes: Secondary | ICD-10-CM

## 2018-03-26 DIAGNOSIS — N393 Stress incontinence (female) (male): Secondary | ICD-10-CM | POA: Diagnosis not present

## 2018-03-26 DIAGNOSIS — M6281 Muscle weakness (generalized): Secondary | ICD-10-CM | POA: Diagnosis not present

## 2018-03-26 DIAGNOSIS — M62838 Other muscle spasm: Secondary | ICD-10-CM | POA: Diagnosis not present

## 2018-03-26 DIAGNOSIS — C61 Malignant neoplasm of prostate: Secondary | ICD-10-CM

## 2018-04-02 DIAGNOSIS — C61 Malignant neoplasm of prostate: Secondary | ICD-10-CM | POA: Diagnosis not present

## 2018-04-05 ENCOUNTER — Ambulatory Visit (HOSPITAL_COMMUNITY)
Admission: RE | Admit: 2018-04-05 | Discharge: 2018-04-05 | Disposition: A | Discharge status: Home / Self Care | Payer: BLUE CROSS/BLUE SHIELD | Source: Ambulatory Visit | Attending: Urology | Admitting: Urology

## 2018-04-05 DIAGNOSIS — R590 Localized enlarged lymph nodes: Secondary | ICD-10-CM | POA: Diagnosis not present

## 2018-04-05 DIAGNOSIS — C61 Malignant neoplasm of prostate: Secondary | ICD-10-CM | POA: Diagnosis not present

## 2018-04-05 MED ORDER — AXUMIN (FLUCICLOVINE F 18) INJECTION
10.5100 | Freq: Once | INTRAVENOUS | Status: AC
  Administered 2018-04-05: 10.51 via INTRAVENOUS

## 2018-04-09 ENCOUNTER — Other Ambulatory Visit: Payer: Self-pay | Admitting: Family Medicine

## 2018-04-09 DIAGNOSIS — N5231 Erectile dysfunction following radical prostatectomy: Secondary | ICD-10-CM | POA: Diagnosis not present

## 2018-04-09 DIAGNOSIS — R9721 Rising PSA following treatment for malignant neoplasm of prostate: Secondary | ICD-10-CM | POA: Diagnosis not present

## 2018-04-09 DIAGNOSIS — N393 Stress incontinence (female) (male): Secondary | ICD-10-CM | POA: Diagnosis not present

## 2018-04-09 NOTE — Telephone Encounter (Signed)
[facility] is requesting to fill pt xanax . Please advise . [place]

## 2018-04-17 ENCOUNTER — Ambulatory Visit: Payer: BLUE CROSS/BLUE SHIELD | Admitting: Radiation Oncology

## 2018-04-17 ENCOUNTER — Encounter: Payer: Self-pay | Admitting: Urology

## 2018-04-17 ENCOUNTER — Ambulatory Visit: Payer: BLUE CROSS/BLUE SHIELD

## 2018-04-17 ENCOUNTER — Ambulatory Visit
Admission: RE | Admit: 2018-04-17 | Discharge: 2018-04-17 | Disposition: A | Discharge status: Home / Self Care | Payer: BLUE CROSS/BLUE SHIELD | Source: Ambulatory Visit | Attending: Urology | Admitting: Urology

## 2018-04-17 ENCOUNTER — Ambulatory Visit
Admission: RE | Admit: 2018-04-17 | Discharge: 2018-04-17 | Disposition: A | Discharge status: Home / Self Care | Payer: BLUE CROSS/BLUE SHIELD | Source: Ambulatory Visit | Attending: Radiation Oncology | Admitting: Radiation Oncology

## 2018-04-17 VITALS — BP 140/90 | HR 81 | Temp 98.4°F | Resp 18 | Ht 66.5 in | Wt 152.2 lb

## 2018-04-17 DIAGNOSIS — Z510 Encounter for antineoplastic radiation therapy: Secondary | ICD-10-CM | POA: Insufficient documentation

## 2018-04-17 DIAGNOSIS — Z9079 Acquired absence of other genital organ(s): Secondary | ICD-10-CM | POA: Diagnosis not present

## 2018-04-17 DIAGNOSIS — Z5111 Encounter for antineoplastic chemotherapy: Secondary | ICD-10-CM | POA: Diagnosis not present

## 2018-04-17 DIAGNOSIS — C775 Secondary and unspecified malignant neoplasm of intrapelvic lymph nodes: Secondary | ICD-10-CM | POA: Diagnosis not present

## 2018-04-17 DIAGNOSIS — C61 Malignant neoplasm of prostate: Secondary | ICD-10-CM | POA: Diagnosis not present

## 2018-04-17 DIAGNOSIS — R9721 Rising PSA following treatment for malignant neoplasm of prostate: Secondary | ICD-10-CM | POA: Diagnosis not present

## 2018-04-17 NOTE — Progress Notes (Signed)
Radiation Oncology         [phone] ________________________________  Outpatient Follow Up New  Name: Jeff Alexander MRN: [id number]  Date: 04/17/2018  DOB: [birth year]  [name], Jeff Jarvis, MD  Jeff Hughs, MD   REFERRING PHYSICIAN: Ardis Hughs, MD  DIAGNOSIS: 58 y.o. gentleman with rising, detectable PSA of 4.23 s/p RALP for stage pT3b, pN1 adenocarcinoma of the prostate with a Gleason's score of 4+5 and a preop PSA of 111.0 and current PSA of 4.23    ICD-10-CM   1. Malignant neoplasm of prostate ([place]) [facility] to [place] ILLNESS::Jeff Alexander is a 58 y.o. gentleman with with a rising, detectable PSA of 4.23 s/p prostatectomy. He was initially seen in multidisciplinary prostate clinic on 12/19/2017 to discuss potential options for cT2c adenocarcinoma of the prostate with Gleason's score 4+5 and PSA of 111. His initial staging CT Pelvis did show a suspicious 9 mm right obturator lymph node but no evidence of distant metastatic disease on CT or bone scan.  He opted for prostatectomy and underwent this procedure, performed by Dr. Louis Alexander on 01/12/2018. Unfortunately, his final surgical pathology revealed pT3bN1 disease with extraprostatic extension and positive margins at the left anterior and bilateral posterior from apex to base and bladder neck area, bilateral seminal vesicle invasion, and 2 of 15 nodes sampled were positive. His initial postoperative PSA was 2.62 on 02/26/18 and further elevated at 4.23 on 04/02/2018.  An Axumin PET scan was performed on 04/05/2018 for disease restaging and this study revealed increased radiotracer uptake in two small 7 mm lymph nodes along the right iliac vessels highly concerning for prostate cancer pelvic nodal metastasis but no evidence of nodal metastasis outside the pelvis and no evidence of distant metastatic disease or skeletal metastasis.  He has continued working with physical therapy to  regain continence and continues making gradual improvement, at this point only requiring approximately 1 pad per day.  However, he would like to see this improved further.  Unfortunately, within the past week, the patient's mother passed away and he is the executor of her estate so he has tremendous stress currently and he therefore has been distracted and has slacked off on his pelvic floor exercises.   The patient reviewed his surgical pathology and recent PSA results with his urologist and has kindly been referred today for discussion of potential adjuvant radiotherapy to the prostate fossa in combination with ADT.  He has a follow up visit with Dr. Louis Alexander to initiate Lupron 04/17/18.   PREVIOUS RADIATION THERAPY: No  PAST MEDICAL HISTORY:  has a past medical history of Anxiety, Chronic back pain, Headache, and Prostate cancer ([place]).    PAST SURGICAL HISTORY: Past Surgical History:  Procedure Laterality Date  . COLONOSCOPY    . PELVIC LYMPH NODE DISSECTION N/A 01/12/2018   Procedure: PELVIC LYMPH NODE DISSECTION, EXTENDED;  Surgeon: Jeff Hughs, MD;  Location: [facility];  Service: Urology;  Laterality: N/A;  . PROSTATECTOMY     robot assisted laparoscopic Dr. Louis Alexander 01-12-18   . ROBOT ASSISTED LAPAROSCOPIC RADICAL PROSTATECTOMY N/A 01/12/2018   Procedure: XI ROBOTIC ASSISTED LAPAROSCOPIC RADICAL PROSTATECTOMY;  Surgeon: Jeff Hughs, MD;  Location: [facility];  Service: Urology;  Laterality: N/A;  NEEDS 240 MIN FOR ALL PROCEDURES    FAMILY HISTORY: family history includes Breast cancer (age of onset: 32) in his maternal aunt; Melanoma (age of onset: 13) in his maternal aunt.  SOCIAL HISTORY:  reports that he has never smoked. He has never used smokeless tobacco. He reports current alcohol use of about 3.0 standard drinks of alcohol per week. He reports that he does not use drugs. Single. No children. Works in Building control surveyor.  ALLERGIES: Codeine  MEDICATIONS:  Current  Outpatient Medications  Medication Sig Dispense Refill  . ALPRAZolam (XANAX) 0.25 MG tablet TAKE (1) TABLET TWICE DAILY AS NEEDED FOR ANXIETY. 50 tablet 0  . loratadine (CLARITIN) 10 MG tablet Take 10 mg by mouth daily as needed for allergies.     No current facility-administered medications for this encounter.     REVIEW OF SYSTEMS:  On review of systems, the patient reports that he is doing well overall. He denies any chest pain, shortness of breath, cough, fevers, chills, night sweats, or unintended weight changes. He reports loss of sleep and fatigue. He reports headaches. He reports anxiety. He denies any bowel disturbances, and denies abdominal pain, nausea or vomiting. He reports chronic back and neck pain but denies any new musculoskeletal or joint aches or pains. His IPSS was 10, indicating moderate urinary symptoms. He reports that he is not currently sexually active.  A complete review of systems is obtained and is otherwise negative.   PHYSICAL EXAM:  Wt Readings from Last 3 Encounters:  04/17/18 152 lb 4 oz (69.1 kg)  01/12/18 150 lb 3.2 oz (68.1 kg)  01/09/18 150 lb 3.2 oz (68.1 kg)   Temp Readings from Last 3 Encounters:  04/17/18 98.4 F (36.9 C) (Oral)  01/14/18 99.5 F (37.5 C) (Oral)  01/09/18 98.2 F (36.8 C) (Oral)   BP Readings from Last 3 Encounters:  04/17/18 140/90  01/14/18 127/78  01/09/18 133/86   Pulse Readings from Last 3 Encounters:  04/17/18 81  01/14/18 80  01/09/18 86   Pain Assessment Pain Score: 0-No pain/10  In general this is a well appearing Caucasian male in no acute distress. He is alert and oriented x4 and appropriate throughout the examination. HEENT reveals that the patient is normocephalic, atraumatic. EOMs are intact. PERRLA. Skin is intact without any evidence of gross lesions. Cardiovascular exam reveals a regular rate and rhythm, no clicks rubs or murmurs are auscultated. Chest is clear to auscultation bilaterally. Lymphatic  assessment is performed and does not reveal any adenopathy in the cervical, supraclavicular, axillary, or inguinal chains. Abdomen has active bowel sounds in all quadrants and is intact. The abdomen is soft, non tender, non distended. Lower extremities are negative for pretibial pitting edema, deep calf tenderness, cyanosis or clubbing.   KPS = 100  100 - Normal; no complaints; no evidence of disease. 90   - Able to carry on normal activity; minor signs or symptoms of disease. 80   - Normal activity with effort; some signs or symptoms of disease. 63   - Cares for self; unable to carry on normal activity or to do active work. 60   - Requires occasional assistance, but is able to care for most of his personal needs. 50   - Requires considerable assistance and frequent medical care. 53   - Disabled; requires special care and assistance. 54   - Severely disabled; hospital admission is indicated although death not imminent. 78   - Very sick; hospital admission necessary; active supportive treatment necessary. 10   - Moribund; fatal processes progressing rapidly. 0     - Dead  Karnofsky DA, Abelmann WH, Craver LS and Burchenal [facility] [phone]) The use of the nitrogen mustards in  the palliative treatment of carcinoma: with particular reference to bronchogenic carcinoma Cancer 1 [phone]   LABORATORY DATA:  Lab Results  Component Value Date   WBC 6.7 01/09/2018   HGB 12.9 (L) 01/13/2018   HCT 38.6 (L) 01/13/2018   MCV 90.9 01/09/2018   PLT 230 01/09/2018   Lab Results  Component Value Date   NA 133 (L) 01/13/2018   K 4.0 01/13/2018   CL 102 01/13/2018   CO2 22 01/13/2018   Lab Results  Component Value Date   ALT 20 01/09/2018   AST 20 01/09/2018   ALKPHOS 60 01/09/2018   BILITOT 0.9 01/09/2018     RADIOGRAPHY: Nm Pet (axumin) Skull Base To Mid Thigh  Result Date: 04/05/2018 CLINICAL DATA:  Prostate carcinoma with biochemical recurrence. Prostatectomy 01/12/2018. PSA 2.2 on 03/15/2018.  EXAM: NUCLEAR MEDICINE PET SKULL BASE TO THIGH TECHNIQUE: 10.5 mCi F-18 Fluciclovine was injected intravenously. Full-ring PET imaging was performed from the skull base to thigh after the radiotracer. CT data was obtained and used for attenuation correction and anatomic localization. COMPARISON:  None. FINDINGS: NECK No radiotracer activity in neck lymph nodes. Incidental CT finding: None CHEST No radiotracer accumulation within mediastinal or hilar lymph nodes. No suspicious pulmonary nodules on the CT scan. Incidental CT finding: Coronary artery calcification and aortic atherosclerotic calcification. ABDOMEN/PELVIS Prostate: No focal activity in the prostate bed. Lymph nodes: RIGHT internal iliac (along the operator space) measures 7 mm (image 172/4) with mild to moderate radiotracer activity (SUV max equal 3 9). Second RIGHT common iliac lymph node is identified by radiotracer activity measuring 7 mm on image 156/4 with SUV max equal 3.7. No lymph nodes recognized outside of the pelvis. Mild activity associated with inguinal lymph nodes is favored benign. Liver: No evidence of liver metastasis Incidental CT finding: None SKELETON No focal  activity to suggest skeletal metastasis. IMPRESSION: 1. Two small lymph nodes along the RIGHT iliac vessels identified by mild to moderate radiotracer activity are highly concerning for prostate cancer pelvic nodal metastasis. 2. No evidence of nodal metastasis outside the pelvis. No evidence distant metastatic disease. 3. No skeletal metastasis Electronically Signed   By: Suzy Bouchard M.D.   On: 04/05/2018 15:22      IMPRESSION/PLAN: 58 y.o. gentleman with rising, detectable PSA of 4.23 s/p RALP for stage pT3b, pN1 adenocarcinoma of the prostate with a Gleason's score of 4+5 and a pretreatment PSA of 111.0. Today we reviewed the findings and workup thus far.  We discussed the natural history of prostate cancer.  We reviewed the the implications of positive margins,  extracapsular extension, seminal vesicle involvement and detectable postoperative PSA on the risk of prostate cancer recurrence. We discussed the advantage for patients who undergo adjuvant radiotherapy in the setting in terms of disease control and overall survival. We discussed radiation treatment directed to the prostatic fossa with regard to the logistics and delivery of external beam radiation treatment.  The recommendation is for a 7-1/2-week course of daily radiotherapy to the prostate fossa in combination with approximately 18 to 24 months of ADT.  At the conclusion of our conversation, the patient elects to proceed with adjuvant radiotherapy to the prostate fossa in combination with ADT.  He is scheduled to receive his first Lupron injection at [facility] urology on 04/17/2018.  We will share our findings with Dr. Louis Alexander and coordinate for CT simulation in the near future, once the patient has regained satisfactory continence and has recovered from the recent loss of his mother.  He will let us know when he is ready to move forward with radiotherapy.    We enjoyed meeting with him today, and will look forward to participating in the care of this very nice gentleman.    We spent more than 50% of today's visit in counseling and/or coordination of care.     Nicholos Johns, PA-C    Tyler Pita, MD  [facility] Direct Dial: [phone]  Fax: [phone] [email or web address]  Skype  [email or web address]  This document serves as a record of services personally performed by Tyler Pita, MD and Freeman Caldron, PA-C. It was created on their behalf by Wilburn Mylar, a trained medical scribe. The creation of this record is based on the scribe's personal observations and the providers' statements to them. This document has been checked and approved by the attending providers.  References:  JAMA. 2006 Nov 15;296(19):2329-35.  Adjuvant radiotherapy for pathologically advanced  prostate cancer: a randomized clinical trial.  [place] Silos IM Jr(1), Tangen CM, Hettie Holstein MS, Ova Freshwater, Messing Wayland Denis ED.  Author information: (1)Department of Urology, [facility] at [place], [place], [address], [place] [address], [place]. [email or web address]  CONTEXT: Despite a stage-shift to earlier cancer stages and lower tumor volumes for prostate cancer, pathologically advanced disease is detected at radical prostatectomy in 38% to 52% of patients. However, the optimal management of these patients after radical prostatectomy is unknown.  OBJECTIVE: To determine whether adjuvant radiotherapy improves metastasis-free survival in patients with stage pT3 N0 M0 prostate cancer.  DESIGN, SETTING, AND PATIENTS: Randomized, prospective, multi-institutional, [place] clinical trial with enrollment between November 10, 1986, and March 29, 1995 (with database frozen for statistical analysis on December 17, 2003). Patients were 59 men with pathologically advanced prostate cancer who had undergone radical prostatectomy. INTERVENTION: Men were randomly assigned to receive 60 to 64 Gy of external beam radiotherapy delivered to the prostatic fossa (n = 214) or usual care plus observation (n = 211).  MAIN OUTCOME MEASURES: Primary outcome was metastasis-free survival, defined as time to first occurrence of metastatic disease or death due to any cause. Secondary outcomes included prostate-specific antigen (PSA) relapse, recurrence-free survival, overall survival, freedom from hormonal therapy, and postoperative complications.  RESULTS: Among the 425 men, median follow-up was 10.6 years (interquartile range, 9.2-12.7 years). For metastasis-free survival, 76 (35.5%) of 214 men in the adjuvant radiotherapy group were diagnosed with metastatic disease or died (median metastasis-free estimate, 14.7 years), compared  with 91 (43.1%) of 211 (median metastasis-free estimate, 13.2 years) of those in the observation group (hazard ratio [HR], 0.75; 95% CI, 0.55-1.02; P = .06). There were no significant between-group differences for overall survival (71 deaths, median survival of 14.7 years for radiotherapy vs 83 deaths, median survival of 13.8 years for observation; HR, 0.80; 95% CI, 0.58-1.09; P = .16). PSA relapse (median PSA relapse-free survival, 10.3 years for radiotherapy vs 3.1 years for observation; HR, 0.43; 95% CI, 0.31-0.58; P<.001) and disease recurrence (median recurrence-free survival, 13.8 years for radiotherapy vs 9.9 years for observation; HR, 0.62; 95% CI, 0.46-0.82; P = .001) were both significantly reduced with radiotherapy. Adverse effects were more common with radiotherapy vs observation (23.8% vs 11.9%), including rectal complications (3.7% vs 0%), urethral strictures (17.8% vs 9.5%), and total urinary incontinence (6.5% vs 2.8%).  CONCLUSIONS: In men who had undergone radical prostatectomy for pathologically advanced prostate cancer, adjuvant radiotherapy resulted in significantly  reduced risk of PSA relapse and disease recurrence, although the improvements in metastasis-free survival and overall survival were not statistically significant.  Trial Registration clinicaltrials.gov Identifier: [id number]. PMID: [id number]   J Clin Oncol. 2007 Jun 1;25(16):2225-9. Predominant treatment failure in postprostatectomy patients is local: analysis of patterns of treatment failure in SWOG 8794.  Swanson GP(1), Espino MA, Tangen CM, Chin J, Messing E, Canby-Hagino Daiva Eves, Doy Hutching ED;  Author information: (1)Department of Radiation Oncology and Urology, [facility] of [facility], [place] [address], [place]. [email or web address] Comment in [place]. 2007 Dec 10;25(35):5671-2.  PURPOSE: [facility] (SWOG) trial [phone] demonstrated that adjuvant radiation reduces  the risk of biochemical (prostate-specific antigen [PSA]) treatment failure by 50% over radical prostatectomy alone. In this analysis, we stratified patients as to their preradiation PSA levels and correlated it with outcomes such as PSA treatment failure, local recurrence, and distant failure, to serve as guidelines for future research.  PATIENTS AND METHODS: Four hundred thirty-one subjects with pathologically advanced prostate cancer (extraprostatic extension, positive surgical margins, or seminal vesicle invasion) were randomly assigned to adjuvant radiotherapy or observation.  RESULTS: Three hundred seventy-four eligible patients had immediate postprostatectomy and follow-up PSA data. Median follow-up was 10.2 years. For patients with a postsurgical PSA of 0.2 ng/mL, radiation was associated with reductions in the 10-year risk of biochemical treatment failure (72% to 42%), local failures (20% to 7%), and distant failures (12% to 4%). For patients with a postsurgical PSA between higher than 0.2 and <or = 1.0 ng/mL, reductions in the 10-year risk of biochemical failure (80% to 73%), local failures (25% to 9%), and distant failures (16% to 12%) were realized. In patients with postsurgical PSA higher than 1.0, the respective findings were 94% versus 100%, 28% versus 9%, and 44% versus 18%.  CONCLUSION: The pattern of treatment failure in high-risk patients is predominantly local with a surprisingly low incidence of metastatic failure. Adjuvant radiation to the prostate bed reduces the risk of metastatic disease and biochemical failure at all postsurgical PSA levels. Further improvement in reducing local treatment failure is likely to have the greatest impact on outcome in high-risk patients after prostatectomy.  PMID: [id number]     J Urol. 2009 Mar;181(3):956-62.  Adjuvant radiotherapy for pathological T3N0M0 prostate cancer significantly reduces risk of metastases and improves survival: long-term followup  of a randomized clinical trial.  Grandville Silos IM(1), Tangen CM, Hettie Holstein MS, Ova Freshwater, Messing [facility] Denis ED.  Chief Strategy Officer information: (1)[employer] at [place], [place], [place], [place]. PURPOSE: Extraprostatic disease will be manifest in a third of men after radical prostatectomy. We present the long-term followup of a randomized clinical trial of radiotherapy to reduce the risk of subsequent metastatic disease and death.  MATERIALS AND METHODS: A total of 431 men with pT3N0M0 prostate cancer were randomized to 60 to 64 Gy adjuvant radiotherapy or observation. The primary study end point was metastasis-free survival.  RESULTS: Of 425 eligible men 211 were randomized to observation and 214 to adjuvant radiation. Of those men under observation 70 ultimately received radiotherapy. Metastasis-free survival was significantly greater with radiotherapy (93 of 214 events on the radiotherapy arm vs 114 of 211 events on observation; HR 0.71; 95% CI 0.54, 0.94; p = 0.016). Survival improved significantly with adjuvant radiation (88 deaths of 214 on the radiotherapy arm vs 110 deaths of 211 on observation; HR 0.72; 95% CI 0.55, 0.96;  p = 0.023).  CONCLUSIONS: Adjuvant radiotherapy after radical prostatectomy for a man with pT3N0M0 prostate cancer significantly reduces the risk of metastasis and increases survival.  PMCID: [id number] PMID: [id number]

## 2018-04-19 ENCOUNTER — Encounter: Payer: Self-pay | Admitting: General Practice

## 2018-04-19 NOTE — Progress Notes (Signed)
[place] Psychosocial Distress Screening Clinical Social Work  Clinical Social Work was referred by distress screening protocol.  The patient scored a 5 on the Psychosocial Distress Thermometer which indicates moderate distress. Clinical Social Worker contacted patient by phone to assess for distress and other psychosocial needs. Unable to reach patient by phone, left VM, w information about [place], encouraged to call as needed for support/resources.    ONCBCN DISTRESS SCREENING 04/17/2018  Screening Type Initial Screening  Distress experienced in past week (1-10) 5  Family Problem type Other (comment)  Emotional problem type Nervousness/Anxiety;Adjusting to illness  Physical Problem type   Physician notified of physical symptoms No  Referral to clinical psychology No  Referral to clinical social work No  Referral to dietition No  Referral to financial advocate No  Referral to support programs No  Referral to palliative care No     Clinical Social Worker follow up needed: No.  If yes, follow up plan:  Beverely Pace, [place], LCSW Clinical Social Worker Phone:  [phone]

## 2018-04-27 ENCOUNTER — Encounter: Payer: Self-pay | Admitting: Medical Oncology

## 2018-04-27 ENCOUNTER — Ambulatory Visit
Admission: RE | Admit: 2018-04-27 | Payer: BLUE CROSS/BLUE SHIELD | Source: Ambulatory Visit | Admitting: Radiation Oncology

## 2018-04-27 NOTE — Progress Notes (Signed)
Jeff Alexander here for CT simulation after biochemical recurrence. He had robotic prostatectomy 01/12/18 with positive lymph nodes and margins. He received Lupron 04/17/18 and after arriving today, he has decided to postpone his CT simulation for a month. 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 He is rescheduled for CT simulation 05/25/18. I asked him to call me with questions or concerns. He voiced understanding.

## 2018-05-04 DIAGNOSIS — M6281 Muscle weakness (generalized): Secondary | ICD-10-CM | POA: Diagnosis not present

## 2018-05-04 DIAGNOSIS — N393 Stress incontinence (female) (male): Secondary | ICD-10-CM | POA: Diagnosis not present

## 2018-05-04 DIAGNOSIS — M62838 Other muscle spasm: Secondary | ICD-10-CM | POA: Diagnosis not present

## 2018-05-10 ENCOUNTER — Telehealth: Payer: Self-pay | Admitting: Radiation Oncology

## 2018-05-10 NOTE — Telephone Encounter (Signed)
Circled back to ensure patient is aware his start date has been changed to 3/12 as he requested. No answer. Left message detailing new dates and time. Left my contact number for future questions.

## 2018-05-10 NOTE — Telephone Encounter (Signed)
-----   Message from [place], [place] sent at 05/09/2018  4:23 PM EST ----- Regarding: RE: Start date There is not a problem with him waiting to start treatment on 06/07/18 instead of 3/10.  I'm not sure how these dates got screwed up since he was actually here in person when they set the calendar but I do know that he is under a lot of stress with managing his mother's estate on her recent passing and maybe he requested the wrong dates.  Either way, it is not a problem for him to delay start by 2 days.  I will let CT SIM know and they should contact him to make the correction, if you will please just give him a head's up. Thanks! -Ashlyn ----- Message ----- From: Heywood Footman, RN Sent: 05/09/2018  12:12 PM EST To: Freeman Caldron, PA-C Subject: Start date                                     Ashlyn.  I received a call from this patient very frustrated. He reports he understood his start date for radiation would be 05/09/2018. He made arrangements to be out of town for work on 2/10 and 2/11. Per MyChart his start date is 2/10. He wants to know if he can delay starting until 2/12 as originally planned.   Sam

## 2018-05-25 ENCOUNTER — Ambulatory Visit
Admission: RE | Admit: 2018-05-25 | Discharge: 2018-05-25 | Disposition: A | Discharge status: Home / Self Care | Payer: BLUE CROSS/BLUE SHIELD | Source: Ambulatory Visit | Attending: Radiation Oncology | Admitting: Radiation Oncology

## 2018-05-25 DIAGNOSIS — Z510 Encounter for antineoplastic radiation therapy: Secondary | ICD-10-CM | POA: Diagnosis not present

## 2018-05-25 DIAGNOSIS — C61 Malignant neoplasm of prostate: Secondary | ICD-10-CM | POA: Diagnosis not present

## 2018-05-25 NOTE — Progress Notes (Signed)
  Radiation Oncology         [phone] ________________________________  Name: Jeff Alexander MRN: [id number]  Date: 05/25/2018  DOB: [birth year]  SIMULATION AND TREATMENT PLANNING NOTE    ICD-10-CM   1. Malignant neoplasm of prostate ([place]) C61     DIAGNOSIS:  58 y.o. gentleman with rising, detectable PSA of 4.23 s/p RALP for stage pT3b, pN1 adenocarcinoma of the prostate with a Gleason's score of 4+5 and a pretreatment PSA of 111.0.  NARRATIVE:  The patient was brought to the [facility].  Identity was confirmed.  All relevant records and images related to the planned course of therapy were reviewed.  The patient freely provided informed written consent to proceed with treatment after reviewing the details related to the planned course of therapy. The consent form was witnessed and verified by the simulation staff.  Then, the patient was set-up in a stable reproducible supine position for radiation therapy.  A vacuum lock pillow device was custom fabricated to position his legs in a reproducible immobilized position.  Then, I performed a urethrogram under sterile conditions to identify the prostatic bed.  CT images were obtained.  Surface markings were placed.  The CT images were loaded into the planning software.  Then the prostate bed target, pelvic lymph node target and avoidance structures including the rectum, bladder, bowel and hips were contoured.  Treatment planning then occurred.  The radiation prescription was entered and confirmed.  A total of one complex treatment devices were fabricated. I have requested : Intensity Modulated Radiotherapy (IMRT) is medically necessary for this case for the following reason:  Rectal sparing.Marland Kitchen  PLAN:  The patient will receive 45 Gy in 25 fractions of 1.8 Gy, followed by a boost to the prostate bed and 2 PET positive nodes to a total dose of 68.4 Gy with 13 additional fractions of 1.8 Gy.   ________________________________  Sheral Apley Tammi Klippel, M.D.  This document serves as a record of services personally performed by Tyler Pita, MD. It was created on his behalf by Wilburn Mylar, a trained medical scribe. The creation of this record is based on the scribe's personal observations and the provider's statements to them. This document has been checked and approved by the attending provider.

## 2018-05-27 ENCOUNTER — Ambulatory Visit: Payer: BLUE CROSS/BLUE SHIELD | Admitting: Radiation Oncology

## 2018-06-01 DIAGNOSIS — Z510 Encounter for antineoplastic radiation therapy: Secondary | ICD-10-CM | POA: Insufficient documentation

## 2018-06-01 DIAGNOSIS — C61 Malignant neoplasm of prostate: Secondary | ICD-10-CM | POA: Insufficient documentation

## 2018-06-04 DIAGNOSIS — N393 Stress incontinence (female) (male): Secondary | ICD-10-CM | POA: Diagnosis not present

## 2018-06-04 DIAGNOSIS — M6281 Muscle weakness (generalized): Secondary | ICD-10-CM | POA: Diagnosis not present

## 2018-06-04 DIAGNOSIS — M62838 Other muscle spasm: Secondary | ICD-10-CM | POA: Diagnosis not present

## 2018-06-05 ENCOUNTER — Ambulatory Visit: Payer: BLUE CROSS/BLUE SHIELD

## 2018-06-06 ENCOUNTER — Ambulatory Visit: Payer: BLUE CROSS/BLUE SHIELD

## 2018-06-07 ENCOUNTER — Ambulatory Visit
Admission: RE | Admit: 2018-06-07 | Discharge: 2018-06-07 | Disposition: A | Discharge status: Home / Self Care | Payer: BLUE CROSS/BLUE SHIELD | Source: Ambulatory Visit | Attending: Radiation Oncology | Admitting: Radiation Oncology

## 2018-06-07 DIAGNOSIS — Z510 Encounter for antineoplastic radiation therapy: Secondary | ICD-10-CM | POA: Diagnosis not present

## 2018-06-07 DIAGNOSIS — C61 Malignant neoplasm of prostate: Secondary | ICD-10-CM | POA: Diagnosis not present

## 2018-06-08 ENCOUNTER — Ambulatory Visit
Admission: RE | Admit: 2018-06-08 | Discharge: 2018-06-08 | Disposition: A | Discharge status: Home / Self Care | Payer: BLUE CROSS/BLUE SHIELD | Source: Ambulatory Visit | Attending: Radiation Oncology | Admitting: Radiation Oncology

## 2018-06-08 DIAGNOSIS — C61 Malignant neoplasm of prostate: Secondary | ICD-10-CM | POA: Diagnosis not present

## 2018-06-08 DIAGNOSIS — Z510 Encounter for antineoplastic radiation therapy: Secondary | ICD-10-CM | POA: Diagnosis not present

## 2018-06-11 ENCOUNTER — Other Ambulatory Visit: Payer: Self-pay

## 2018-06-11 ENCOUNTER — Other Ambulatory Visit: Payer: Self-pay | Admitting: Family Medicine

## 2018-06-11 ENCOUNTER — Ambulatory Visit
Admission: RE | Admit: 2018-06-11 | Discharge: 2018-06-11 | Disposition: A | Discharge status: Home / Self Care | Payer: BLUE CROSS/BLUE SHIELD | Source: Ambulatory Visit | Attending: Radiation Oncology | Admitting: Radiation Oncology

## 2018-06-11 DIAGNOSIS — Z510 Encounter for antineoplastic radiation therapy: Secondary | ICD-10-CM | POA: Diagnosis not present

## 2018-06-11 DIAGNOSIS — C61 Malignant neoplasm of prostate: Secondary | ICD-10-CM | POA: Diagnosis not present

## 2018-06-11 NOTE — Telephone Encounter (Signed)
[facility] is requesting to fill pt xanax please advise [facility]

## 2018-06-12 ENCOUNTER — Other Ambulatory Visit: Payer: Self-pay

## 2018-06-12 ENCOUNTER — Ambulatory Visit
Admission: RE | Admit: 2018-06-12 | Discharge: 2018-06-12 | Disposition: A | Discharge status: Home / Self Care | Payer: BLUE CROSS/BLUE SHIELD | Source: Ambulatory Visit | Attending: Radiation Oncology | Admitting: Radiation Oncology

## 2018-06-12 DIAGNOSIS — Z510 Encounter for antineoplastic radiation therapy: Secondary | ICD-10-CM | POA: Diagnosis not present

## 2018-06-12 DIAGNOSIS — C61 Malignant neoplasm of prostate: Secondary | ICD-10-CM | POA: Diagnosis not present

## 2018-06-13 ENCOUNTER — Other Ambulatory Visit: Payer: Self-pay

## 2018-06-13 ENCOUNTER — Ambulatory Visit
Admission: RE | Admit: 2018-06-13 | Discharge: 2018-06-13 | Disposition: A | Discharge status: Home / Self Care | Payer: BLUE CROSS/BLUE SHIELD | Source: Ambulatory Visit | Attending: Radiation Oncology | Admitting: Radiation Oncology

## 2018-06-13 DIAGNOSIS — C61 Malignant neoplasm of prostate: Secondary | ICD-10-CM | POA: Diagnosis not present

## 2018-06-13 DIAGNOSIS — Z510 Encounter for antineoplastic radiation therapy: Secondary | ICD-10-CM | POA: Diagnosis not present

## 2018-06-14 ENCOUNTER — Other Ambulatory Visit: Payer: Self-pay

## 2018-06-14 ENCOUNTER — Ambulatory Visit
Admission: RE | Admit: 2018-06-14 | Discharge: 2018-06-14 | Disposition: A | Discharge status: Home / Self Care | Payer: BLUE CROSS/BLUE SHIELD | Source: Ambulatory Visit | Attending: Radiation Oncology | Admitting: Radiation Oncology

## 2018-06-14 DIAGNOSIS — C61 Malignant neoplasm of prostate: Secondary | ICD-10-CM | POA: Diagnosis not present

## 2018-06-14 DIAGNOSIS — Z510 Encounter for antineoplastic radiation therapy: Secondary | ICD-10-CM | POA: Diagnosis not present

## 2018-06-15 ENCOUNTER — Other Ambulatory Visit: Payer: Self-pay

## 2018-06-15 ENCOUNTER — Ambulatory Visit
Admission: RE | Admit: 2018-06-15 | Discharge: 2018-06-15 | Disposition: A | Discharge status: Home / Self Care | Payer: BLUE CROSS/BLUE SHIELD | Source: Ambulatory Visit | Attending: Radiation Oncology | Admitting: Radiation Oncology

## 2018-06-15 DIAGNOSIS — C61 Malignant neoplasm of prostate: Secondary | ICD-10-CM | POA: Diagnosis not present

## 2018-06-15 DIAGNOSIS — Z510 Encounter for antineoplastic radiation therapy: Secondary | ICD-10-CM | POA: Diagnosis not present

## 2018-06-18 ENCOUNTER — Ambulatory Visit
Admission: RE | Admit: 2018-06-18 | Discharge: 2018-06-18 | Disposition: A | Discharge status: Home / Self Care | Payer: BLUE CROSS/BLUE SHIELD | Source: Ambulatory Visit | Attending: Radiation Oncology | Admitting: Radiation Oncology

## 2018-06-18 ENCOUNTER — Other Ambulatory Visit: Payer: Self-pay

## 2018-06-18 DIAGNOSIS — C61 Malignant neoplasm of prostate: Secondary | ICD-10-CM | POA: Diagnosis not present

## 2018-06-18 DIAGNOSIS — Z510 Encounter for antineoplastic radiation therapy: Secondary | ICD-10-CM | POA: Diagnosis not present

## 2018-06-19 ENCOUNTER — Other Ambulatory Visit: Payer: Self-pay

## 2018-06-19 ENCOUNTER — Ambulatory Visit
Admission: RE | Admit: 2018-06-19 | Discharge: 2018-06-19 | Disposition: A | Discharge status: Home / Self Care | Payer: BLUE CROSS/BLUE SHIELD | Source: Ambulatory Visit | Attending: Radiation Oncology | Admitting: Radiation Oncology

## 2018-06-19 DIAGNOSIS — Z510 Encounter for antineoplastic radiation therapy: Secondary | ICD-10-CM | POA: Diagnosis not present

## 2018-06-19 DIAGNOSIS — C61 Malignant neoplasm of prostate: Secondary | ICD-10-CM | POA: Diagnosis not present

## 2018-06-20 ENCOUNTER — Other Ambulatory Visit: Payer: Self-pay

## 2018-06-20 ENCOUNTER — Ambulatory Visit
Admission: RE | Admit: 2018-06-20 | Discharge: 2018-06-20 | Disposition: A | Discharge status: Home / Self Care | Payer: BLUE CROSS/BLUE SHIELD | Source: Ambulatory Visit | Attending: Radiation Oncology | Admitting: Radiation Oncology

## 2018-06-20 DIAGNOSIS — C61 Malignant neoplasm of prostate: Secondary | ICD-10-CM | POA: Diagnosis not present

## 2018-06-20 DIAGNOSIS — Z510 Encounter for antineoplastic radiation therapy: Secondary | ICD-10-CM | POA: Diagnosis not present

## 2018-06-21 ENCOUNTER — Ambulatory Visit
Admission: RE | Admit: 2018-06-21 | Discharge: 2018-06-21 | Disposition: A | Discharge status: Home / Self Care | Payer: BLUE CROSS/BLUE SHIELD | Source: Ambulatory Visit | Attending: Radiation Oncology | Admitting: Radiation Oncology

## 2018-06-21 ENCOUNTER — Other Ambulatory Visit: Payer: Self-pay

## 2018-06-21 DIAGNOSIS — Z510 Encounter for antineoplastic radiation therapy: Secondary | ICD-10-CM | POA: Diagnosis not present

## 2018-06-21 DIAGNOSIS — C61 Malignant neoplasm of prostate: Secondary | ICD-10-CM | POA: Diagnosis not present

## 2018-06-22 ENCOUNTER — Ambulatory Visit
Admission: RE | Admit: 2018-06-22 | Discharge: 2018-06-22 | Disposition: A | Discharge status: Home / Self Care | Payer: BLUE CROSS/BLUE SHIELD | Source: Ambulatory Visit | Attending: Radiation Oncology | Admitting: Radiation Oncology

## 2018-06-22 ENCOUNTER — Other Ambulatory Visit: Payer: Self-pay

## 2018-06-22 DIAGNOSIS — Z510 Encounter for antineoplastic radiation therapy: Secondary | ICD-10-CM | POA: Diagnosis not present

## 2018-06-22 DIAGNOSIS — C61 Malignant neoplasm of prostate: Secondary | ICD-10-CM | POA: Diagnosis not present

## 2018-06-25 ENCOUNTER — Other Ambulatory Visit: Payer: Self-pay

## 2018-06-25 ENCOUNTER — Ambulatory Visit
Admission: RE | Admit: 2018-06-25 | Discharge: 2018-06-25 | Disposition: A | Discharge status: Home / Self Care | Payer: BLUE CROSS/BLUE SHIELD | Source: Ambulatory Visit | Attending: Radiation Oncology | Admitting: Radiation Oncology

## 2018-06-25 DIAGNOSIS — C61 Malignant neoplasm of prostate: Secondary | ICD-10-CM | POA: Diagnosis not present

## 2018-06-25 DIAGNOSIS — Z510 Encounter for antineoplastic radiation therapy: Secondary | ICD-10-CM | POA: Diagnosis not present

## 2018-06-26 ENCOUNTER — Ambulatory Visit
Admission: RE | Admit: 2018-06-26 | Discharge: 2018-06-26 | Disposition: A | Discharge status: Home / Self Care | Payer: BLUE CROSS/BLUE SHIELD | Source: Ambulatory Visit | Attending: Radiation Oncology | Admitting: Radiation Oncology

## 2018-06-26 ENCOUNTER — Ambulatory Visit: Payer: BLUE CROSS/BLUE SHIELD | Admitting: Radiation Oncology

## 2018-06-26 ENCOUNTER — Other Ambulatory Visit: Payer: Self-pay

## 2018-06-26 DIAGNOSIS — Z510 Encounter for antineoplastic radiation therapy: Secondary | ICD-10-CM | POA: Diagnosis not present

## 2018-06-26 DIAGNOSIS — C61 Malignant neoplasm of prostate: Secondary | ICD-10-CM | POA: Diagnosis not present

## 2018-06-27 ENCOUNTER — Ambulatory Visit
Admission: RE | Admit: 2018-06-27 | Discharge: 2018-06-27 | Disposition: A | Discharge status: Home / Self Care | Payer: BLUE CROSS/BLUE SHIELD | Source: Ambulatory Visit | Attending: Radiation Oncology | Admitting: Radiation Oncology

## 2018-06-27 ENCOUNTER — Other Ambulatory Visit: Payer: Self-pay

## 2018-06-27 DIAGNOSIS — C61 Malignant neoplasm of prostate: Secondary | ICD-10-CM | POA: Insufficient documentation

## 2018-06-27 DIAGNOSIS — Z510 Encounter for antineoplastic radiation therapy: Secondary | ICD-10-CM | POA: Insufficient documentation

## 2018-06-28 ENCOUNTER — Other Ambulatory Visit: Payer: Self-pay

## 2018-06-28 ENCOUNTER — Ambulatory Visit
Admission: RE | Admit: 2018-06-28 | Discharge: 2018-06-28 | Disposition: A | Discharge status: Home / Self Care | Payer: BLUE CROSS/BLUE SHIELD | Source: Ambulatory Visit | Attending: Radiation Oncology | Admitting: Radiation Oncology

## 2018-06-28 DIAGNOSIS — Z510 Encounter for antineoplastic radiation therapy: Secondary | ICD-10-CM | POA: Diagnosis not present

## 2018-06-28 DIAGNOSIS — C61 Malignant neoplasm of prostate: Secondary | ICD-10-CM | POA: Diagnosis not present

## 2018-06-29 ENCOUNTER — Ambulatory Visit
Admission: RE | Admit: 2018-06-29 | Discharge: 2018-06-29 | Disposition: A | Discharge status: Home / Self Care | Payer: BLUE CROSS/BLUE SHIELD | Source: Ambulatory Visit | Attending: Radiation Oncology | Admitting: Radiation Oncology

## 2018-06-29 ENCOUNTER — Other Ambulatory Visit: Payer: Self-pay

## 2018-06-29 DIAGNOSIS — C61 Malignant neoplasm of prostate: Secondary | ICD-10-CM | POA: Diagnosis not present

## 2018-06-29 DIAGNOSIS — Z510 Encounter for antineoplastic radiation therapy: Secondary | ICD-10-CM | POA: Diagnosis not present

## 2018-07-02 ENCOUNTER — Ambulatory Visit
Admission: RE | Admit: 2018-07-02 | Discharge: 2018-07-02 | Disposition: A | Discharge status: Home / Self Care | Payer: BLUE CROSS/BLUE SHIELD | Source: Ambulatory Visit | Attending: Radiation Oncology | Admitting: Radiation Oncology

## 2018-07-02 ENCOUNTER — Other Ambulatory Visit: Payer: Self-pay

## 2018-07-02 DIAGNOSIS — C61 Malignant neoplasm of prostate: Secondary | ICD-10-CM | POA: Diagnosis not present

## 2018-07-02 DIAGNOSIS — Z510 Encounter for antineoplastic radiation therapy: Secondary | ICD-10-CM | POA: Diagnosis not present

## 2018-07-03 ENCOUNTER — Other Ambulatory Visit: Payer: Self-pay

## 2018-07-03 ENCOUNTER — Ambulatory Visit
Admission: RE | Admit: 2018-07-03 | Discharge: 2018-07-03 | Disposition: A | Discharge status: Home / Self Care | Payer: BLUE CROSS/BLUE SHIELD | Source: Ambulatory Visit | Attending: Radiation Oncology | Admitting: Radiation Oncology

## 2018-07-03 DIAGNOSIS — Z510 Encounter for antineoplastic radiation therapy: Secondary | ICD-10-CM | POA: Diagnosis not present

## 2018-07-03 DIAGNOSIS — C61 Malignant neoplasm of prostate: Secondary | ICD-10-CM | POA: Diagnosis not present

## 2018-07-04 ENCOUNTER — Ambulatory Visit
Admission: RE | Admit: 2018-07-04 | Discharge: 2018-07-04 | Disposition: A | Discharge status: Home / Self Care | Payer: BLUE CROSS/BLUE SHIELD | Source: Ambulatory Visit | Attending: Radiation Oncology | Admitting: Radiation Oncology

## 2018-07-04 ENCOUNTER — Other Ambulatory Visit: Payer: Self-pay

## 2018-07-04 DIAGNOSIS — Z510 Encounter for antineoplastic radiation therapy: Secondary | ICD-10-CM | POA: Diagnosis not present

## 2018-07-04 DIAGNOSIS — C61 Malignant neoplasm of prostate: Secondary | ICD-10-CM | POA: Diagnosis not present

## 2018-07-05 ENCOUNTER — Other Ambulatory Visit: Payer: Self-pay

## 2018-07-05 ENCOUNTER — Ambulatory Visit
Admission: RE | Admit: 2018-07-05 | Discharge: 2018-07-05 | Disposition: A | Discharge status: Home / Self Care | Payer: BLUE CROSS/BLUE SHIELD | Source: Ambulatory Visit | Attending: Radiation Oncology | Admitting: Radiation Oncology

## 2018-07-05 DIAGNOSIS — Z510 Encounter for antineoplastic radiation therapy: Secondary | ICD-10-CM | POA: Diagnosis not present

## 2018-07-05 DIAGNOSIS — C61 Malignant neoplasm of prostate: Secondary | ICD-10-CM | POA: Diagnosis not present

## 2018-07-06 ENCOUNTER — Ambulatory Visit
Admission: RE | Admit: 2018-07-06 | Discharge: 2018-07-06 | Disposition: A | Discharge status: Home / Self Care | Payer: BLUE CROSS/BLUE SHIELD | Source: Ambulatory Visit | Attending: Radiation Oncology | Admitting: Radiation Oncology

## 2018-07-06 ENCOUNTER — Other Ambulatory Visit: Payer: Self-pay

## 2018-07-06 ENCOUNTER — Other Ambulatory Visit: Payer: Self-pay | Admitting: Radiation Oncology

## 2018-07-06 DIAGNOSIS — Z510 Encounter for antineoplastic radiation therapy: Secondary | ICD-10-CM | POA: Diagnosis not present

## 2018-07-06 DIAGNOSIS — C61 Malignant neoplasm of prostate: Secondary | ICD-10-CM | POA: Diagnosis not present

## 2018-07-06 MED ORDER — VENLAFAXINE HCL 37.5 MG PO TABS: 37.5000 mg | ORAL_TABLET | Freq: Two times a day (BID) | ORAL | 0 refills | Status: DC

## 2018-07-09 ENCOUNTER — Ambulatory Visit
Admission: RE | Admit: 2018-07-09 | Discharge: 2018-07-09 | Disposition: A | Discharge status: Home / Self Care | Payer: BLUE CROSS/BLUE SHIELD | Source: Ambulatory Visit | Attending: Radiation Oncology | Admitting: Radiation Oncology

## 2018-07-09 ENCOUNTER — Other Ambulatory Visit: Payer: Self-pay

## 2018-07-09 DIAGNOSIS — Z510 Encounter for antineoplastic radiation therapy: Secondary | ICD-10-CM | POA: Diagnosis not present

## 2018-07-09 DIAGNOSIS — C61 Malignant neoplasm of prostate: Secondary | ICD-10-CM | POA: Diagnosis not present

## 2018-07-10 ENCOUNTER — Other Ambulatory Visit: Payer: Self-pay

## 2018-07-10 ENCOUNTER — Ambulatory Visit
Admission: RE | Admit: 2018-07-10 | Discharge: 2018-07-10 | Disposition: A | Discharge status: Home / Self Care | Payer: BLUE CROSS/BLUE SHIELD | Source: Ambulatory Visit | Attending: Radiation Oncology | Admitting: Radiation Oncology

## 2018-07-10 DIAGNOSIS — C61 Malignant neoplasm of prostate: Secondary | ICD-10-CM | POA: Diagnosis not present

## 2018-07-10 DIAGNOSIS — Z510 Encounter for antineoplastic radiation therapy: Secondary | ICD-10-CM | POA: Diagnosis not present

## 2018-07-11 ENCOUNTER — Other Ambulatory Visit: Payer: Self-pay

## 2018-07-11 ENCOUNTER — Ambulatory Visit
Admission: RE | Admit: 2018-07-11 | Discharge: 2018-07-11 | Disposition: A | Discharge status: Home / Self Care | Payer: BLUE CROSS/BLUE SHIELD | Source: Ambulatory Visit | Attending: Radiation Oncology | Admitting: Radiation Oncology

## 2018-07-11 DIAGNOSIS — Z510 Encounter for antineoplastic radiation therapy: Secondary | ICD-10-CM | POA: Diagnosis not present

## 2018-07-11 DIAGNOSIS — C61 Malignant neoplasm of prostate: Secondary | ICD-10-CM | POA: Diagnosis not present

## 2018-07-12 ENCOUNTER — Other Ambulatory Visit: Payer: Self-pay

## 2018-07-12 ENCOUNTER — Ambulatory Visit
Admission: RE | Admit: 2018-07-12 | Discharge: 2018-07-12 | Disposition: A | Discharge status: Home / Self Care | Payer: BLUE CROSS/BLUE SHIELD | Source: Ambulatory Visit | Attending: Radiation Oncology | Admitting: Radiation Oncology

## 2018-07-12 DIAGNOSIS — C778 Secondary and unspecified malignant neoplasm of lymph nodes of multiple regions: Secondary | ICD-10-CM | POA: Diagnosis not present

## 2018-07-12 DIAGNOSIS — N5231 Erectile dysfunction following radical prostatectomy: Secondary | ICD-10-CM | POA: Diagnosis not present

## 2018-07-12 DIAGNOSIS — C61 Malignant neoplasm of prostate: Secondary | ICD-10-CM | POA: Diagnosis not present

## 2018-07-12 DIAGNOSIS — Z510 Encounter for antineoplastic radiation therapy: Secondary | ICD-10-CM | POA: Diagnosis not present

## 2018-07-12 DIAGNOSIS — N393 Stress incontinence (female) (male): Secondary | ICD-10-CM | POA: Diagnosis not present

## 2018-07-13 ENCOUNTER — Other Ambulatory Visit: Payer: Self-pay

## 2018-07-13 ENCOUNTER — Ambulatory Visit
Admission: RE | Admit: 2018-07-13 | Discharge: 2018-07-13 | Disposition: A | Discharge status: Home / Self Care | Payer: BLUE CROSS/BLUE SHIELD | Source: Ambulatory Visit | Attending: Radiation Oncology | Admitting: Radiation Oncology

## 2018-07-13 DIAGNOSIS — C61 Malignant neoplasm of prostate: Secondary | ICD-10-CM | POA: Diagnosis not present

## 2018-07-13 DIAGNOSIS — Z510 Encounter for antineoplastic radiation therapy: Secondary | ICD-10-CM | POA: Diagnosis not present

## 2018-07-16 ENCOUNTER — Ambulatory Visit
Admission: RE | Admit: 2018-07-16 | Discharge: 2018-07-16 | Disposition: A | Discharge status: Home / Self Care | Payer: BLUE CROSS/BLUE SHIELD | Source: Ambulatory Visit | Attending: Radiation Oncology | Admitting: Radiation Oncology

## 2018-07-16 ENCOUNTER — Other Ambulatory Visit: Payer: Self-pay

## 2018-07-16 DIAGNOSIS — C61 Malignant neoplasm of prostate: Secondary | ICD-10-CM | POA: Diagnosis not present

## 2018-07-16 DIAGNOSIS — Z510 Encounter for antineoplastic radiation therapy: Secondary | ICD-10-CM | POA: Diagnosis not present

## 2018-07-17 ENCOUNTER — Other Ambulatory Visit: Payer: Self-pay

## 2018-07-17 ENCOUNTER — Ambulatory Visit
Admission: RE | Admit: 2018-07-17 | Discharge: 2018-07-17 | Disposition: A | Discharge status: Home / Self Care | Payer: BLUE CROSS/BLUE SHIELD | Source: Ambulatory Visit | Attending: Radiation Oncology | Admitting: Radiation Oncology

## 2018-07-17 DIAGNOSIS — Z510 Encounter for antineoplastic radiation therapy: Secondary | ICD-10-CM | POA: Diagnosis not present

## 2018-07-17 DIAGNOSIS — C61 Malignant neoplasm of prostate: Secondary | ICD-10-CM | POA: Diagnosis not present

## 2018-07-18 ENCOUNTER — Ambulatory Visit: Payer: BLUE CROSS/BLUE SHIELD

## 2018-07-19 ENCOUNTER — Other Ambulatory Visit: Payer: Self-pay

## 2018-07-19 ENCOUNTER — Ambulatory Visit
Admission: RE | Admit: 2018-07-19 | Discharge: 2018-07-19 | Disposition: A | Discharge status: Home / Self Care | Payer: BLUE CROSS/BLUE SHIELD | Source: Ambulatory Visit | Attending: Radiation Oncology | Admitting: Radiation Oncology

## 2018-07-19 DIAGNOSIS — Z510 Encounter for antineoplastic radiation therapy: Secondary | ICD-10-CM | POA: Diagnosis not present

## 2018-07-19 DIAGNOSIS — C61 Malignant neoplasm of prostate: Secondary | ICD-10-CM | POA: Diagnosis not present

## 2018-07-20 ENCOUNTER — Ambulatory Visit
Admission: RE | Admit: 2018-07-20 | Discharge: 2018-07-20 | Disposition: A | Discharge status: Home / Self Care | Payer: BLUE CROSS/BLUE SHIELD | Source: Ambulatory Visit | Attending: Radiation Oncology | Admitting: Radiation Oncology

## 2018-07-20 ENCOUNTER — Other Ambulatory Visit: Payer: Self-pay

## 2018-07-20 DIAGNOSIS — C61 Malignant neoplasm of prostate: Secondary | ICD-10-CM | POA: Diagnosis not present

## 2018-07-20 DIAGNOSIS — M6281 Muscle weakness (generalized): Secondary | ICD-10-CM | POA: Diagnosis not present

## 2018-07-20 DIAGNOSIS — M62838 Other muscle spasm: Secondary | ICD-10-CM | POA: Diagnosis not present

## 2018-07-20 DIAGNOSIS — Z510 Encounter for antineoplastic radiation therapy: Secondary | ICD-10-CM | POA: Diagnosis not present

## 2018-07-20 DIAGNOSIS — N393 Stress incontinence (female) (male): Secondary | ICD-10-CM | POA: Diagnosis not present

## 2018-07-23 ENCOUNTER — Other Ambulatory Visit: Payer: Self-pay

## 2018-07-23 ENCOUNTER — Ambulatory Visit
Admission: RE | Admit: 2018-07-23 | Discharge: 2018-07-23 | Disposition: A | Discharge status: Home / Self Care | Payer: BLUE CROSS/BLUE SHIELD | Source: Ambulatory Visit | Attending: Radiation Oncology | Admitting: Radiation Oncology

## 2018-07-23 DIAGNOSIS — Z510 Encounter for antineoplastic radiation therapy: Secondary | ICD-10-CM | POA: Diagnosis not present

## 2018-07-23 DIAGNOSIS — C61 Malignant neoplasm of prostate: Secondary | ICD-10-CM | POA: Diagnosis not present

## 2018-07-24 ENCOUNTER — Ambulatory Visit
Admission: RE | Admit: 2018-07-24 | Discharge: 2018-07-24 | Disposition: A | Discharge status: Home / Self Care | Payer: BLUE CROSS/BLUE SHIELD | Source: Ambulatory Visit | Attending: Radiation Oncology | Admitting: Radiation Oncology

## 2018-07-24 ENCOUNTER — Other Ambulatory Visit: Payer: Self-pay

## 2018-07-24 DIAGNOSIS — C61 Malignant neoplasm of prostate: Secondary | ICD-10-CM | POA: Diagnosis not present

## 2018-07-24 DIAGNOSIS — Z510 Encounter for antineoplastic radiation therapy: Secondary | ICD-10-CM | POA: Diagnosis not present

## 2018-07-25 ENCOUNTER — Other Ambulatory Visit: Payer: Self-pay

## 2018-07-25 ENCOUNTER — Other Ambulatory Visit: Payer: Self-pay | Admitting: Family Medicine

## 2018-07-25 ENCOUNTER — Ambulatory Visit
Admission: RE | Admit: 2018-07-25 | Discharge: 2018-07-25 | Disposition: A | Discharge status: Home / Self Care | Payer: BLUE CROSS/BLUE SHIELD | Source: Ambulatory Visit | Attending: Radiation Oncology | Admitting: Radiation Oncology

## 2018-07-25 DIAGNOSIS — C61 Malignant neoplasm of prostate: Secondary | ICD-10-CM | POA: Diagnosis not present

## 2018-07-25 DIAGNOSIS — Z510 Encounter for antineoplastic radiation therapy: Secondary | ICD-10-CM | POA: Diagnosis not present

## 2018-07-25 NOTE — Telephone Encounter (Signed)
Is this ok to refill?  

## 2018-07-26 ENCOUNTER — Ambulatory Visit
Admission: RE | Admit: 2018-07-26 | Discharge: 2018-07-26 | Disposition: A | Discharge status: Home / Self Care | Payer: BLUE CROSS/BLUE SHIELD | Source: Ambulatory Visit | Attending: Radiation Oncology | Admitting: Radiation Oncology

## 2018-07-26 DIAGNOSIS — C61 Malignant neoplasm of prostate: Secondary | ICD-10-CM | POA: Diagnosis not present

## 2018-07-26 DIAGNOSIS — Z510 Encounter for antineoplastic radiation therapy: Secondary | ICD-10-CM | POA: Diagnosis not present

## 2018-07-27 ENCOUNTER — Ambulatory Visit
Admission: RE | Admit: 2018-07-27 | Discharge: 2018-07-27 | Disposition: A | Discharge status: Home / Self Care | Payer: BLUE CROSS/BLUE SHIELD | Source: Ambulatory Visit | Attending: Radiation Oncology | Admitting: Radiation Oncology

## 2018-07-27 ENCOUNTER — Other Ambulatory Visit: Payer: Self-pay

## 2018-07-27 DIAGNOSIS — Z510 Encounter for antineoplastic radiation therapy: Secondary | ICD-10-CM | POA: Diagnosis not present

## 2018-07-27 DIAGNOSIS — C61 Malignant neoplasm of prostate: Secondary | ICD-10-CM | POA: Insufficient documentation

## 2018-07-30 ENCOUNTER — Other Ambulatory Visit: Payer: Self-pay

## 2018-07-30 ENCOUNTER — Ambulatory Visit: Payer: BLUE CROSS/BLUE SHIELD

## 2018-07-30 ENCOUNTER — Ambulatory Visit
Admission: RE | Admit: 2018-07-30 | Discharge: 2018-07-30 | Disposition: A | Discharge status: Home / Self Care | Payer: BLUE CROSS/BLUE SHIELD | Source: Ambulatory Visit | Attending: Radiation Oncology | Admitting: Radiation Oncology

## 2018-07-30 DIAGNOSIS — C61 Malignant neoplasm of prostate: Secondary | ICD-10-CM | POA: Diagnosis not present

## 2018-07-30 DIAGNOSIS — Z510 Encounter for antineoplastic radiation therapy: Secondary | ICD-10-CM | POA: Diagnosis not present

## 2018-07-31 ENCOUNTER — Ambulatory Visit
Admission: RE | Admit: 2018-07-31 | Discharge: 2018-07-31 | Disposition: A | Discharge status: Home / Self Care | Payer: BLUE CROSS/BLUE SHIELD | Source: Ambulatory Visit | Attending: Radiation Oncology | Admitting: Radiation Oncology

## 2018-07-31 ENCOUNTER — Other Ambulatory Visit: Payer: Self-pay

## 2018-07-31 ENCOUNTER — Encounter: Payer: Self-pay | Admitting: Radiation Oncology

## 2018-07-31 DIAGNOSIS — Z510 Encounter for antineoplastic radiation therapy: Secondary | ICD-10-CM | POA: Diagnosis not present

## 2018-07-31 DIAGNOSIS — C61 Malignant neoplasm of prostate: Secondary | ICD-10-CM | POA: Diagnosis not present

## 2018-09-14 ENCOUNTER — Other Ambulatory Visit: Payer: Self-pay | Admitting: Family Medicine

## 2018-09-14 NOTE — Telephone Encounter (Signed)
Is this ok to refill?  

## 2018-10-11 DIAGNOSIS — C61 Malignant neoplasm of prostate: Secondary | ICD-10-CM | POA: Diagnosis not present

## 2018-10-18 DIAGNOSIS — C61 Malignant neoplasm of prostate: Secondary | ICD-10-CM | POA: Diagnosis not present

## 2018-10-23 ENCOUNTER — Telehealth: Payer: Self-pay

## 2018-10-23 NOTE — Telephone Encounter (Signed)
Patient stated he got a Lupron shot and shingles is a side effect of the shot. Pt stated he has has shingles before and it seems he has it again. He wants to know if he needs to be seen or if something can be sent to the pharmacy?

## 2018-10-23 NOTE — Progress Notes (Signed)
  Radiation Oncology         [phone] ________________________________  Name: Jeff Alexander MRN: [id number]  Date: 07/31/2018  DOB: [birth year]  End of Treatment Note  Diagnosis:    58 y.o. gentleman with rising, detectable PSA of 4.23 s/p RALP for stage pT3b, pN1 adenocarcinoma of the prostate with a Gleason's score of 4+5 and a pretreatment PSA of 111.0     Indication for treatment:  Curative, Definitive Radiotherapy       Radiation treatment dates:   4/12-07/31/18  Site/dose:  1. The prostate fossa and pelvic lymph nodes were initially treated to 45 Gy in 25 fractions of 1.8 Gy  2. The prostate fossa and 2 PET positive nodes were boosted to 68.4 Gy with 13 additional fractions of 1.8 Gy   Beams/energy:  1. The prostate fossa and pelvic lymph nodes were initially treated using helical intensity modulated radiotherapy delivering 6 megavolt photons. Image guidance was performed with megavoltage CT studies prior to each fraction. He was immobilized with a body fix lower extremity mold.  2. The prostate fossa only was boosted using helical intensity modulated radiotherapy delivering 6 megavolt photons. Image guidance was performed with megavoltage CT studies prior to each fraction. He was immobilized with a body fix lower extremity mold.  Narrative: The patient tolerated radiation treatment relatively well.   The patient experienced some minor urinary irritation and modest fatigue.    Plan: The patient has completed radiation treatment. He will return to radiation oncology clinic for routine followup in one month. I advised him to call or return sooner if he has any questions or concerns related to his recovery or treatment. ________________________________  Sheral Apley. Tammi Klippel, M.D.

## 2018-10-24 ENCOUNTER — Ambulatory Visit: Payer: BC Managed Care – PPO | Admitting: Family Medicine

## 2018-10-24 ENCOUNTER — Other Ambulatory Visit: Payer: Self-pay

## 2018-10-24 ENCOUNTER — Encounter: Payer: Self-pay | Admitting: Family Medicine

## 2018-10-24 VITALS — Temp 96.8°F | Wt 158.0 lb

## 2018-10-24 DIAGNOSIS — B029 Zoster without complications: Secondary | ICD-10-CM | POA: Diagnosis not present

## 2018-10-24 MED ORDER — VALACYCLOVIR HCL 1 G PO TABS: 1000.0000 mg | ORAL_TABLET | Freq: Three times a day (TID) | ORAL | 0 refills | Status: DC

## 2018-10-24 NOTE — Progress Notes (Signed)
   Subjective:    Patient ID: Jeff Alexander, male    DOB: 06-Oct-1960, 58 y.o.   MRN: [id number]  HPI Documentation for virtual telephone encounter. Documentation for virtual audio and video telecommunications through [place] encounter: The patient was located at home. The provider was located in the office. The patient did consent to this visit and is aware of possible charges through their insurance for this visit. The other persons participating in this telemedicine service were none. Time spent on call was 10 minutes  This virtual service is not related to other E/M service within previous 7 days. He has a 3-day history of noting erythema and slight tenderness in and around the right eye and also a lesion on the nose.  He has a previous history of shingles.  He is being treated for his prostate cancer.  Review of Systems     Objective:   Physical Exam Visual phone exam does show slight erythema on the right temporal area and around the eye but difficult to see due to using the phone.       Assessment & Plan:  Herpes zoster without complication - Plan: valACYclovir (VALTREX) 1000 MG tablet, Ambulatory referral to Ophthalmology, difficult to tell exactly whether this is shingles but under the circumstances I think is best to go ahead and treat it as if it is.  He will keep me informed if this gets worse.

## 2018-10-24 NOTE — Telephone Encounter (Signed)
VIRTUAL scheduled for this afternoon

## 2018-10-24 NOTE — Telephone Encounter (Signed)
Set him up for virtual visit today

## 2018-11-01 DIAGNOSIS — B0239 Other herpes zoster eye disease: Secondary | ICD-10-CM | POA: Diagnosis not present

## 2018-11-01 DIAGNOSIS — H1045 Other chronic allergic conjunctivitis: Secondary | ICD-10-CM | POA: Diagnosis not present

## 2018-11-28 ENCOUNTER — Other Ambulatory Visit: Payer: Self-pay | Admitting: Radiation Oncology

## 2018-11-28 ENCOUNTER — Telehealth: Payer: Self-pay | Admitting: Family Medicine

## 2018-11-28 NOTE — Telephone Encounter (Signed)
Refill request for Venlafaxine HCl 37.5 please send to the [place], [place]

## 2018-11-28 NOTE — Telephone Encounter (Signed)
Schedule him for a virtual appointment.

## 2018-11-29 NOTE — Telephone Encounter (Signed)
Scheduled for 1:30pm on 12/04/2018.

## 2018-11-30 ENCOUNTER — Other Ambulatory Visit: Payer: Self-pay | Admitting: Radiation Oncology

## 2018-12-04 ENCOUNTER — Other Ambulatory Visit: Payer: Self-pay

## 2018-12-04 ENCOUNTER — Ambulatory Visit: Payer: BC Managed Care – PPO | Admitting: Family Medicine

## 2018-12-04 ENCOUNTER — Encounter: Payer: Self-pay | Admitting: Family Medicine

## 2018-12-04 VITALS — Temp 98.1°F | Wt 158.5 lb

## 2018-12-04 DIAGNOSIS — B0239 Other herpes zoster eye disease: Secondary | ICD-10-CM | POA: Diagnosis not present

## 2018-12-04 DIAGNOSIS — C61 Malignant neoplasm of prostate: Secondary | ICD-10-CM

## 2018-12-04 DIAGNOSIS — H1045 Other chronic allergic conjunctivitis: Secondary | ICD-10-CM | POA: Diagnosis not present

## 2018-12-04 MED ORDER — VENLAFAXINE HCL ER 75 MG PO CP24: 75.0000 mg | ORAL_CAPSULE | Freq: Every day | ORAL | 1 refills | Status: DC

## 2018-12-04 MED ORDER — ALPRAZOLAM 0.25 MG PO TABS: ORAL_TABLET | ORAL | 1 refills | Status: DC

## 2018-12-04 NOTE — Progress Notes (Signed)
   Subjective:    Patient ID: Jeff Alexander, male    DOB: May 30, 1960, 58 y.o.   MRN: [id number]  HPI Documentation for virtual telephone encounter.  Documentation for virtual audio and video telecommunications through [place] encounter: The patient was located at home. The provider was located in the office. The patient did consent to this visit and is aware of possible charges through their insurance for this visit. The other persons participating in this telemedicine service were none. Time spent on call was 5 minutes and in review of previous records >18 minutes total. This virtual service is not related to other E/M service within previous 7 days. He has an underlying history of prostate cancer and has had surgery as well as radiation.  His most recent PSA in June was 0.36.  He continues on Lupron as well as Xtandi.  He has had hot flashes from these and is now using Effexor 37.5 and states that he is getting about 80% of an improvement with that.  He does use Xanax to help when he has difficulty with anxiety but review of the record indicates he is using that less than once per day.  He seems to be able to handle this fairly well psychologically.  He has a good support system.    Review of Systems     Objective:   Physical Exam Alert and in no distress with appropriate affect.       Assessment & Plan:  Prostate cancer ([place]) - Plan: venlafaxine XR (EFFEXOR XR) 75 MG 24 hr capsule, ALPRAZolam (XANAX) 0.25 MG tablet I discussed going higher on Effexor to see if he get better relief of his hot flash symptoms.  We will try the higher dose and he will let me know how he is doing.  We will also send him up to get the flu shot and recommend sometime next year coming in for complete examination.  He was comfortable with that.

## 2018-12-06 ENCOUNTER — Other Ambulatory Visit (INDEPENDENT_AMBULATORY_CARE_PROVIDER_SITE_OTHER): Payer: BC Managed Care – PPO

## 2018-12-06 ENCOUNTER — Other Ambulatory Visit: Payer: Self-pay

## 2018-12-06 DIAGNOSIS — Z23 Encounter for immunization: Secondary | ICD-10-CM

## 2019-01-17 DIAGNOSIS — C61 Malignant neoplasm of prostate: Secondary | ICD-10-CM | POA: Diagnosis not present

## 2019-03-06 ENCOUNTER — Other Ambulatory Visit: Payer: Self-pay | Admitting: Family Medicine

## 2019-03-06 DIAGNOSIS — C61 Malignant neoplasm of prostate: Secondary | ICD-10-CM

## 2019-03-06 NOTE — Telephone Encounter (Signed)
Is this okay to refill? 

## 2019-04-15 DIAGNOSIS — C61 Malignant neoplasm of prostate: Secondary | ICD-10-CM | POA: Diagnosis not present

## 2019-04-22 DIAGNOSIS — C61 Malignant neoplasm of prostate: Secondary | ICD-10-CM | POA: Diagnosis not present

## 2019-04-22 DIAGNOSIS — C778 Secondary and unspecified malignant neoplasm of lymph nodes of multiple regions: Secondary | ICD-10-CM | POA: Diagnosis not present

## 2019-04-22 DIAGNOSIS — R9721 Rising PSA following treatment for malignant neoplasm of prostate: Secondary | ICD-10-CM | POA: Diagnosis not present

## 2019-06-03 ENCOUNTER — Other Ambulatory Visit: Payer: Self-pay | Admitting: Family Medicine

## 2019-06-03 DIAGNOSIS — C61 Malignant neoplasm of prostate: Secondary | ICD-10-CM

## 2019-06-03 NOTE — Telephone Encounter (Signed)
Is this okay to refill? 

## 2019-06-03 NOTE — Telephone Encounter (Signed)
[facility] is requesting to fill pt effexor please advise [facility]

## 2019-06-03 NOTE — Telephone Encounter (Signed)
He needs a follow-up visit 

## 2019-06-11 ENCOUNTER — Encounter: Payer: Self-pay | Admitting: Family Medicine

## 2019-06-11 ENCOUNTER — Other Ambulatory Visit: Payer: Self-pay

## 2019-06-11 ENCOUNTER — Ambulatory Visit (INDEPENDENT_AMBULATORY_CARE_PROVIDER_SITE_OTHER): Payer: BC Managed Care – PPO | Admitting: Family Medicine

## 2019-06-11 ENCOUNTER — Ambulatory Visit: Payer: BC Managed Care – PPO | Admitting: Family Medicine

## 2019-06-11 VITALS — BP 136/82 | HR 85 | Temp 98.2°F | Wt 172.4 lb

## 2019-06-11 DIAGNOSIS — J309 Allergic rhinitis, unspecified: Secondary | ICD-10-CM | POA: Diagnosis not present

## 2019-06-11 DIAGNOSIS — C61 Malignant neoplasm of prostate: Secondary | ICD-10-CM | POA: Diagnosis not present

## 2019-06-11 DIAGNOSIS — R7309 Other abnormal glucose: Secondary | ICD-10-CM | POA: Diagnosis not present

## 2019-06-11 DIAGNOSIS — Z8619 Personal history of other infectious and parasitic diseases: Secondary | ICD-10-CM | POA: Diagnosis not present

## 2019-06-11 DIAGNOSIS — M65319 Trigger thumb, unspecified thumb: Secondary | ICD-10-CM

## 2019-06-11 DIAGNOSIS — Z13220 Encounter for screening for lipoid disorders: Secondary | ICD-10-CM | POA: Diagnosis not present

## 2019-06-11 DIAGNOSIS — Z0000 Encounter for general adult medical examination without abnormal findings: Secondary | ICD-10-CM | POA: Diagnosis not present

## 2019-06-11 NOTE — Progress Notes (Signed)
   Subjective:    Patient ID: Jeff Alexander, male    DOB: Jan 15, 1961, 58 y.o.   MRN: [id number]  HPI He is here for complete examination.  He does have underlying prostate cancer and has had robotic surgery.  He is now on hormone blocking medication.  He is also taking Effexor which is helping with his hot flashes.  He seems to be handling all this effectively from a psychological position.  He does have underlying allergies and is using Zyrtec with good results.  He does have a history of shingles but has had no residual from that.  Does complain of treating sensation in his left thumb which is starting to interfere with his function.  He does have a prescription for Valtrex but uses this sparingly.  He has no other concerns or complaints.  Family and social history as well as health maintenance and immunizations was reviewed.  His work and home life are stable.   Review of Systems  All other systems reviewed and are negative.      Objective:   Physical Exam Alert and in no distress. Tympanic membranes and canals are normal. Pharyngeal area is normal. Neck is supple without adenopathy or thyromegaly. Cardiac exam shows a regular sinus rhythm without murmurs or gallops. Lungs are clear to auscultation.  Abdominal exam shows no masses or tenderness. Exam of the left thumb does show triggering sensation.      Assessment & Plan:  Routine general medical examination at a health care facility - Plan: CBC with Differential/Platelet, Comprehensive metabolic panel, Lipid panel  Prostate cancer ([place])  History of shingles  History of herpes labialis  Allergic rhinitis, unspecified seasonality, unspecified trigger  Trigger finger of thumb, unspecified laterality He seems to be having the prostate cancer diagnosis and therapy fairly well.  He does occasionally use the Xanax but not to any great extent.  Discussed possibly increasing his Effexor but he is comfortable at his present  dosing. Discussed treatment of the trigger thumb with him.  I will refer him whenever he is ready to get this worked on.

## 2019-06-11 NOTE — Progress Notes (Signed)
   Subjective:    Patient ID: Jeff Alexander, male    DOB: 1960/08/24, 59 y.o.   MRN: [id number]  HPI    Review of Systems     Objective:   Physical Exam        Assessment & Plan:

## 2019-06-12 LAB — COMPREHENSIVE METABOLIC PANEL
ALT: 11 IU/L (ref 0–44)
AST: 13 IU/L (ref 0–40)
Albumin/Globulin Ratio: 1.6 (ref 1.2–2.2)
Albumin: 4.1 g/dL (ref 3.8–4.9)
Alkaline Phosphatase: 121 IU/L — ABNORMAL HIGH (ref 39–117)
BUN/Creatinine Ratio: 16 (ref 9–20)
BUN: 13 mg/dL (ref 6–24)
Bilirubin Total: <0.2 mg/dL (ref 0.0–1.2)
CO2: 24 mmol/L (ref 20–29)
Calcium: 9.5 mg/dL (ref 8.7–10.2)
Chloride: 102 mmol/L (ref 96–106)
Creatinine, Ser: 0.82 mg/dL (ref 0.76–1.27)
GFR calc Af Amer: 113 mL/min/{1.73_m2} (ref >59)
GFR calc non Af Amer: 97 mL/min/{1.73_m2} (ref >59)
Globulin, Total: 2.6 g/dL (ref 1.5–4.5)
Glucose: 111 mg/dL — ABNORMAL HIGH (ref 65–99)
Potassium: 4.8 mmol/L (ref 3.5–5.2)
Sodium: 139 mmol/L (ref 134–144)
Total Protein: 6.7 g/dL (ref 6.0–8.5)

## 2019-06-12 LAB — CBC WITH DIFFERENTIAL/PLATELET
Basophils Absolute: 0 10*3/uL (ref 0.0–0.2)
Basos: 1 %
EOS (ABSOLUTE): 0.2 10*3/uL (ref 0.0–0.4)
Eos: 2 %
Hematocrit: 40.8 % (ref 37.5–51.0)
Hemoglobin: 14.2 g/dL (ref 13.0–17.7)
Immature Grans (Abs): 0 10*3/uL (ref 0.0–0.1)
Immature Granulocytes: 1 %
Lymphocytes Absolute: 1.3 10*3/uL (ref 0.7–3.1)
Lymphs: 21 %
MCH: 30.9 pg (ref 26.6–33.0)
MCHC: 34.8 g/dL (ref 31.5–35.7)
MCV: 89 fL (ref 79–97)
Monocytes Absolute: 0.4 10*3/uL (ref 0.1–0.9)
Monocytes: 6 %
Neutrophils Absolute: 4.2 10*3/uL (ref 1.4–7.0)
Neutrophils: 69 %
Platelets: 320 10*3/uL (ref 150–450)
RBC: 4.6 x10E6/uL (ref 4.14–5.80)
RDW: 12.9 % (ref 11.6–15.4)
WBC: 6.1 10*3/uL (ref 3.4–10.8)

## 2019-06-12 LAB — LIPID PANEL
Chol/HDL Ratio: 4.8 ratio (ref 0.0–5.0)
Cholesterol, Total: 247 mg/dL — ABNORMAL HIGH (ref 100–199)
HDL: 51 mg/dL (ref >39)
LDL Chol Calc (NIH): 155 mg/dL — ABNORMAL HIGH (ref 0–99)
Triglycerides: 222 mg/dL — ABNORMAL HIGH (ref 0–149)
VLDL Cholesterol Cal: 41 mg/dL — ABNORMAL HIGH (ref 5–40)

## 2019-06-13 LAB — HGB A1C W/O EAG: Hgb A1c MFr Bld: 5.8 % — ABNORMAL HIGH (ref 4.8–5.6)

## 2019-06-13 LAB — SPECIMEN STATUS REPORT

## 2019-06-13 IMAGING — CT NM PET NOPR SKULL BASE TO THIGH
1 of 8 series · 1 of 25 positions shown · non-contrast
Comparison: None.

CLINICAL DATA: Prostate carcinoma with biochemical recurrence.
Prostatectomy 01/12/2018. PSA 2.2 on 03/15/2018.

EXAM:
NUCLEAR MEDICINE PET SKULL BASE TO THIGH
TECHNIQUE: 10.5 mCi F-18 Fluciclovine was injected intravenously. Full-ring PET
imaging was performed from the skull base to thigh after the
radiotracer. CT data was obtained and used for attenuation
correction and anatomic localization.

[Series 4: ct sk_thigh 5.0 b31f · axial · 5.0mm · 0.98mm/px · 1 of 227 slices shown]
[im 227/227  brain]
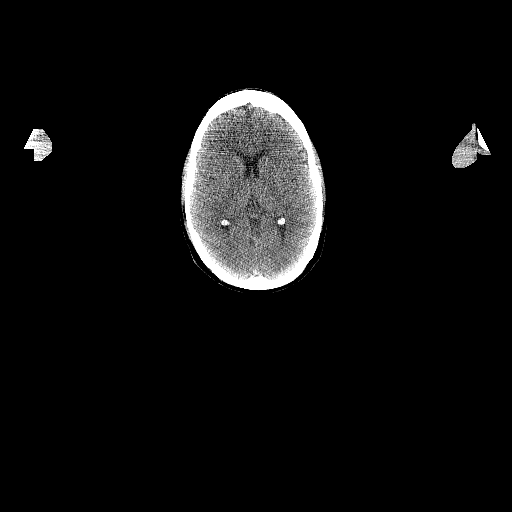

[1 of 25 positions shown; findings below may reference images not displayed]

FINDINGS: NECK

No radiotracer activity in neck lymph nodes.

Incidental CT finding: None

CHEST

No radiotracer accumulation within mediastinal or hilar lymph nodes.
No suspicious pulmonary nodules on the CT scan.

Incidental CT finding: Coronary artery calcification and aortic
atherosclerotic calcification.

ABDOMEN/PELVIS

Prostate: No focal activity in the prostate bed.

Lymph nodes: RIGHT internal iliac (along the operator space)
measures 7 mm (image 172/4) with mild to moderate radiotracer
activity (SUV max equal 3 9). Second RIGHT common iliac lymph node
is identified by radiotracer activity measuring 7 mm on image 156/4
with SUV max equal 3.7.

No lymph nodes recognized outside of the pelvis. Mild activity
associated with inguinal lymph nodes is favored benign.

Liver: No evidence of liver metastasis

Incidental CT finding: None

SKELETON

No focal  activity to suggest skeletal metastasis.
IMPRESSION: 1. Two small lymph nodes along the RIGHT iliac vessels identified by
mild to moderate radiotracer activity are highly concerning for
prostate cancer pelvic nodal metastasis.
2. No evidence of nodal metastasis outside the pelvis. No evidence
distant metastatic disease.
3. No skeletal metastasis

## 2019-07-04 ENCOUNTER — Other Ambulatory Visit: Payer: Self-pay | Admitting: Family Medicine

## 2019-07-04 DIAGNOSIS — C61 Malignant neoplasm of prostate: Secondary | ICD-10-CM

## 2019-07-04 NOTE — Telephone Encounter (Signed)
Jeff Alexander is requesting to fill please venlafaxine. Please advise. kh

## 2019-07-15 DIAGNOSIS — C61 Malignant neoplasm of prostate: Secondary | ICD-10-CM | POA: Diagnosis not present

## 2019-07-15 DIAGNOSIS — N393 Stress incontinence (female) (male): Secondary | ICD-10-CM | POA: Diagnosis not present

## 2019-07-18 ENCOUNTER — Other Ambulatory Visit: Payer: Self-pay | Admitting: Urology

## 2019-07-18 DIAGNOSIS — C801 Malignant (primary) neoplasm, unspecified: Secondary | ICD-10-CM

## 2019-07-18 DIAGNOSIS — C778 Secondary and unspecified malignant neoplasm of lymph nodes of multiple regions: Secondary | ICD-10-CM

## 2019-10-03 ENCOUNTER — Ambulatory Visit
Admission: RE | Admit: 2019-10-03 | Discharge: 2019-10-03 | Disposition: A | Discharge status: Home / Self Care | Payer: BC Managed Care – PPO | Source: Ambulatory Visit | Attending: Urology | Admitting: Urology

## 2019-10-03 ENCOUNTER — Other Ambulatory Visit: Payer: Self-pay

## 2019-10-03 DIAGNOSIS — C778 Secondary and unspecified malignant neoplasm of lymph nodes of multiple regions: Secondary | ICD-10-CM

## 2019-10-03 DIAGNOSIS — C801 Malignant (primary) neoplasm, unspecified: Secondary | ICD-10-CM

## 2019-10-03 DIAGNOSIS — M8588 Other specified disorders of bone density and structure, other site: Secondary | ICD-10-CM | POA: Diagnosis not present

## 2019-10-03 DIAGNOSIS — M810 Age-related osteoporosis without current pathological fracture: Secondary | ICD-10-CM | POA: Diagnosis not present

## 2019-10-08 DIAGNOSIS — C61 Malignant neoplasm of prostate: Secondary | ICD-10-CM | POA: Diagnosis not present

## 2019-10-16 DIAGNOSIS — N393 Stress incontinence (female) (male): Secondary | ICD-10-CM | POA: Diagnosis not present

## 2019-10-16 DIAGNOSIS — M818 Other osteoporosis without current pathological fracture: Secondary | ICD-10-CM | POA: Diagnosis not present

## 2019-10-16 DIAGNOSIS — C61 Malignant neoplasm of prostate: Secondary | ICD-10-CM | POA: Diagnosis not present

## 2019-10-29 DIAGNOSIS — M818 Other osteoporosis without current pathological fracture: Secondary | ICD-10-CM | POA: Diagnosis not present

## 2019-10-31 ENCOUNTER — Other Ambulatory Visit: Payer: Self-pay | Admitting: Family Medicine

## 2019-10-31 DIAGNOSIS — C61 Malignant neoplasm of prostate: Secondary | ICD-10-CM

## 2019-10-31 NOTE — Telephone Encounter (Signed)
Jeff Alexander is requesting to fill pt effexor. Please advise [facility]

## 2020-01-09 DIAGNOSIS — H52201 Unspecified astigmatism, right eye: Secondary | ICD-10-CM | POA: Diagnosis not present

## 2020-01-09 DIAGNOSIS — H40053 Ocular hypertension, bilateral: Secondary | ICD-10-CM | POA: Diagnosis not present

## 2020-01-09 DIAGNOSIS — H524 Presbyopia: Secondary | ICD-10-CM | POA: Diagnosis not present

## 2020-01-09 DIAGNOSIS — H2513 Age-related nuclear cataract, bilateral: Secondary | ICD-10-CM | POA: Diagnosis not present

## 2020-01-09 DIAGNOSIS — H5213 Myopia, bilateral: Secondary | ICD-10-CM | POA: Diagnosis not present

## 2020-01-09 DIAGNOSIS — H0288B Meibomian gland dysfunction left eye, upper and lower eyelids: Secondary | ICD-10-CM | POA: Diagnosis not present

## 2020-01-09 DIAGNOSIS — H0288A Meibomian gland dysfunction right eye, upper and lower eyelids: Secondary | ICD-10-CM | POA: Diagnosis not present

## 2020-01-14 DIAGNOSIS — M818 Other osteoporosis without current pathological fracture: Secondary | ICD-10-CM | POA: Diagnosis not present

## 2020-01-21 DIAGNOSIS — N5231 Erectile dysfunction following radical prostatectomy: Secondary | ICD-10-CM | POA: Diagnosis not present

## 2020-01-21 DIAGNOSIS — C61 Malignant neoplasm of prostate: Secondary | ICD-10-CM | POA: Diagnosis not present

## 2020-01-21 DIAGNOSIS — N393 Stress incontinence (female) (male): Secondary | ICD-10-CM | POA: Diagnosis not present

## 2020-01-21 DIAGNOSIS — R9721 Rising PSA following treatment for malignant neoplasm of prostate: Secondary | ICD-10-CM | POA: Diagnosis not present

## 2020-02-06 ENCOUNTER — Other Ambulatory Visit: Payer: Self-pay

## 2020-02-06 ENCOUNTER — Ambulatory Visit (INDEPENDENT_AMBULATORY_CARE_PROVIDER_SITE_OTHER): Payer: BC Managed Care – PPO

## 2020-02-06 DIAGNOSIS — Z23 Encounter for immunization: Secondary | ICD-10-CM

## 2020-04-24 ENCOUNTER — Other Ambulatory Visit: Payer: Self-pay | Admitting: Family Medicine

## 2020-04-24 ENCOUNTER — Encounter: Payer: Self-pay | Admitting: Family Medicine

## 2020-04-24 ENCOUNTER — Telehealth (INDEPENDENT_AMBULATORY_CARE_PROVIDER_SITE_OTHER): Payer: BC Managed Care – PPO | Admitting: Family Medicine

## 2020-04-24 ENCOUNTER — Other Ambulatory Visit: Payer: BC Managed Care – PPO

## 2020-04-24 VITALS — Wt 160.0 lb

## 2020-04-24 DIAGNOSIS — J3489 Other specified disorders of nose and nasal sinuses: Secondary | ICD-10-CM

## 2020-04-24 DIAGNOSIS — R059 Cough, unspecified: Secondary | ICD-10-CM

## 2020-04-24 DIAGNOSIS — J392 Other diseases of pharynx: Secondary | ICD-10-CM

## 2020-04-24 DIAGNOSIS — R519 Headache, unspecified: Secondary | ICD-10-CM

## 2020-04-24 NOTE — Progress Notes (Signed)
   Subjective:  Documentation for virtual audio and video telecommunications through [place] encounter: Video did not work so we had to complete the visit via telephone.   The patient was located at home. 2 patient identifiers used.  The provider was located in the office. The patient did consent to this visit and is aware of possible charges through their insurance for this visit.  The other persons participating in this telemedicine service were none. Time spent on call was 16 minutes and in review of previous records 20 minutes total.  This virtual service is not related to other E/M service within previous 7 days.   Patient ID: Jeff Alexander, male    DOB: Sep 26, 1960, 60 y.o.   MRN: [id number]  HPI Chief Complaint  Patient presents with  . headache    Headache and tinkle in throat, cough some, runny nose, started this morning   States he woke up with a headache, rhinorrhea, scratchy throat. States he also has a cough which is new.  No fever, chills, body aches, shortness of breath, chest pain, abdominal pain, N/V/D.   States he is supposed to go to a funeral   States he had all 3 Covid vaccines.   He does have prostate cancer and is being treated for this.    Review of Systems Pertinent positives and negatives in the history of present illness.     Objective:   Physical Exam Wt 160 lb (72.6 kg)   BMI 25.44 kg/m   Alert and oriented in no acute distress.  Speaking in complete sentences without difficulty.      Assessment & Plan:  Acute nonintractable headache, unspecified headache type  Throat irritation  Rhinorrhea  Cough  He will come to the office parking lot for rapid Covid and PCR Covid test.  Discussed symptomatic treatment.  Discussed quarantine guidelines.

## 2020-04-26 LAB — SARS-COV-2, NAA 2 DAY TAT

## 2020-04-26 LAB — NOVEL CORONAVIRUS, NAA: SARS-CoV-2, NAA: NOT DETECTED

## 2020-05-12 DIAGNOSIS — C61 Malignant neoplasm of prostate: Secondary | ICD-10-CM | POA: Diagnosis not present

## 2020-05-19 DIAGNOSIS — M818 Other osteoporosis without current pathological fracture: Secondary | ICD-10-CM | POA: Diagnosis not present

## 2020-05-19 DIAGNOSIS — C61 Malignant neoplasm of prostate: Secondary | ICD-10-CM | POA: Diagnosis not present

## 2020-05-19 DIAGNOSIS — R9721 Rising PSA following treatment for malignant neoplasm of prostate: Secondary | ICD-10-CM | POA: Diagnosis not present

## 2020-05-19 DIAGNOSIS — N393 Stress incontinence (female) (male): Secondary | ICD-10-CM | POA: Diagnosis not present

## 2020-05-19 DIAGNOSIS — C778 Secondary and unspecified malignant neoplasm of lymph nodes of multiple regions: Secondary | ICD-10-CM | POA: Diagnosis not present

## 2020-06-11 ENCOUNTER — Other Ambulatory Visit: Payer: Self-pay

## 2020-06-11 ENCOUNTER — Encounter: Payer: Self-pay | Admitting: Family Medicine

## 2020-06-11 ENCOUNTER — Ambulatory Visit (INDEPENDENT_AMBULATORY_CARE_PROVIDER_SITE_OTHER): Payer: BC Managed Care – PPO | Admitting: Family Medicine

## 2020-06-11 VITALS — BP 158/90 | HR 80 | Temp 97.5°F | Ht 65.25 in | Wt 163.8 lb

## 2020-06-11 DIAGNOSIS — C61 Malignant neoplasm of prostate: Secondary | ICD-10-CM

## 2020-06-11 DIAGNOSIS — Z0000 Encounter for general adult medical examination without abnormal findings: Secondary | ICD-10-CM | POA: Diagnosis not present

## 2020-06-11 DIAGNOSIS — M818 Other osteoporosis without current pathological fracture: Secondary | ICD-10-CM

## 2020-06-11 DIAGNOSIS — J309 Allergic rhinitis, unspecified: Secondary | ICD-10-CM

## 2020-06-11 DIAGNOSIS — N393 Stress incontinence (female) (male): Secondary | ICD-10-CM

## 2020-06-11 DIAGNOSIS — Z1211 Encounter for screening for malignant neoplasm of colon: Secondary | ICD-10-CM | POA: Diagnosis not present

## 2020-06-11 DIAGNOSIS — Z8619 Personal history of other infectious and parasitic diseases: Secondary | ICD-10-CM

## 2020-06-11 NOTE — Progress Notes (Signed)
   Subjective:    Patient ID: Jeff Alexander, male    DOB: 06/29/1960, 60 y.o.   MRN: [id number]  HPI He is here for a complete examination.  He is being followed by urology for his metastatic prostate cancer.  He is getting Effexor to help with his hot flashes.  He also continues on Xtandi and is now also taking Prolia for treatment of osteoporosis.  He is also having some urinary incontinence but seems to have this under fairly good control.  He uses several pads per day.  He has had extra calcium and vitamin D to his regimen.  He does have underlying allergies and uses OTC antihistamines without difficulty.  He does use Xanax occasionally.  He seems to have a good handle on the psychological component of this whole thing.  Occasionally he does need to give himself a pep talk.  He has no other concerns or complaints.  Family and social history as well as health maintenance and immunizations was reviewed   Review of Systems  All other systems reviewed and are negative.      Objective:   Physical Exam Routine general medical examination at a health care facility - Plan: CBC with Differential/Platelet, Comprehensive metabolic panel, Lipid panel  History of shingles  History of herpes labialis  Prostate cancer ([place])  Allergic rhinitis, unspecified seasonality, unspecified trigger  Screening for colon cancer - Plan: Cologuard  Other osteoporosis without current pathological fracture - Plan: VITAMIN D 25 Hydroxy (Vit-D Deficiency, Fractures)  Stress incontinence, male  He will continue on his present medication regimen and call when he needs refills on these.  I will check a vitamin D level on him.  Recommend getting Shingrix when he turns 60.  Then discussed the stress that he is under and he seems to be handling this fairly well but did recommend he get in touch with Darryl Hyers for some counseling concerning this.  I will renew his Xanax on an as-needed basis.  He will call if his  allergies cause any trouble.  He will call if he has a herpes outbreak.      Assessment & Plan:

## 2020-06-12 LAB — COMPREHENSIVE METABOLIC PANEL
ALT: 13 IU/L (ref 0–44)
AST: 16 IU/L (ref 0–40)
Albumin/Globulin Ratio: 1.9 (ref 1.2–2.2)
Albumin: 4.5 g/dL (ref 3.8–4.9)
Alkaline Phosphatase: 96 IU/L (ref 44–121)
BUN/Creatinine Ratio: 15 (ref 9–20)
BUN: 13 mg/dL (ref 6–24)
Bilirubin Total: 0.4 mg/dL (ref 0.0–1.2)
CO2: 24 mmol/L (ref 20–29)
Calcium: 9.5 mg/dL (ref 8.7–10.2)
Chloride: 99 mmol/L (ref 96–106)
Creatinine, Ser: 0.85 mg/dL (ref 0.76–1.27)
Globulin, Total: 2.4 g/dL (ref 1.5–4.5)
Glucose: 102 mg/dL — ABNORMAL HIGH (ref 65–99)
Potassium: 4.5 mmol/L (ref 3.5–5.2)
Sodium: 138 mmol/L (ref 134–144)
Total Protein: 6.9 g/dL (ref 6.0–8.5)
eGFR: 100 mL/min/{1.73_m2} (ref >59)

## 2020-06-12 LAB — LIPID PANEL
Chol/HDL Ratio: 5.1 ratio — ABNORMAL HIGH (ref 0.0–5.0)
Cholesterol, Total: 257 mg/dL — ABNORMAL HIGH (ref 100–199)
HDL: 50 mg/dL (ref >39)
LDL Chol Calc (NIH): 162 mg/dL — ABNORMAL HIGH (ref 0–99)
Triglycerides: 241 mg/dL — ABNORMAL HIGH (ref 0–149)
VLDL Cholesterol Cal: 45 mg/dL — ABNORMAL HIGH (ref 5–40)

## 2020-06-12 LAB — CBC WITH DIFFERENTIAL/PLATELET
Basophils Absolute: 0 10*3/uL (ref 0.0–0.2)
Basos: 0 %
EOS (ABSOLUTE): 0.1 10*3/uL (ref 0.0–0.4)
Eos: 2 %
Hematocrit: 39.8 % (ref 37.5–51.0)
Hemoglobin: 13.9 g/dL (ref 13.0–17.7)
Immature Grans (Abs): 0 10*3/uL (ref 0.0–0.1)
Immature Granulocytes: 1 %
Lymphocytes Absolute: 1.4 10*3/uL (ref 0.7–3.1)
Lymphs: 20 %
MCH: 31.4 pg (ref 26.6–33.0)
MCHC: 34.9 g/dL (ref 31.5–35.7)
MCV: 90 fL (ref 79–97)
Monocytes Absolute: 0.4 10*3/uL (ref 0.1–0.9)
Monocytes: 6 %
Neutrophils Absolute: 4.9 10*3/uL (ref 1.4–7.0)
Neutrophils: 71 %
Platelets: 294 10*3/uL (ref 150–450)
RBC: 4.43 x10E6/uL (ref 4.14–5.80)
RDW: 13.1 % (ref 11.6–15.4)
WBC: 6.8 10*3/uL (ref 3.4–10.8)

## 2020-06-12 LAB — VITAMIN D 25 HYDROXY (VIT D DEFICIENCY, FRACTURES): Vit D, 25-Hydroxy: 41.9 ng/mL (ref 30.0–100.0)

## 2020-07-15 DIAGNOSIS — Z1211 Encounter for screening for malignant neoplasm of colon: Secondary | ICD-10-CM | POA: Diagnosis not present

## 2020-07-16 LAB — COLOGUARD: Cologuard: NEGATIVE

## 2020-07-21 LAB — COLOGUARD: COLOGUARD: NEGATIVE

## 2020-07-22 ENCOUNTER — Encounter: Payer: Self-pay | Admitting: Family Medicine

## 2020-07-28 ENCOUNTER — Other Ambulatory Visit: Payer: Self-pay

## 2020-07-28 ENCOUNTER — Telehealth (INDEPENDENT_AMBULATORY_CARE_PROVIDER_SITE_OTHER): Payer: BC Managed Care – PPO | Admitting: Family Medicine

## 2020-07-28 ENCOUNTER — Encounter: Payer: Self-pay | Admitting: Family Medicine

## 2020-07-28 VITALS — Ht 65.25 in | Wt 163.0 lb

## 2020-07-28 DIAGNOSIS — Z20822 Contact with and (suspected) exposure to covid-19: Secondary | ICD-10-CM

## 2020-07-28 NOTE — Progress Notes (Signed)
   Subjective:    Patient ID: Jeff Alexander, male    DOB: February 24, 1961, 60 y.o.   MRN: [id number]  HPI Documentation for virtual audio and video telecommunications through [place] encounter: The patient was located at home. 2 patient identifiers used.  The provider was located in the office. The patient did consent to this visit and is aware of possible charges through their insurance for this visit. The other persons participating in this telemedicine service were none. Time spent on call was 5 minutes and in review of previous records >20 minutes total for counseling and coordination of care. This virtual service is not related to other E/M service within previous 7 days. He called concerning COVID exposure.  He is exposed on Sunday when he [place]  Presently he is having no symptoms at all and has had his Pfizer vaccines x3  Review of Systems     Objective:   Physical Exam Alert and in no distress otherwise not examined       Assessment & Plan:  Close exposure to COVID-19 virus I discussed the criteria for him and he needs to stay sequestered for the next 5 days and if he develops symptoms then get tested.  After 5 days he can get tested and if negative then he should still wear a mask for a total of 10 days.  If he has other questions or concerns, he will call.

## 2020-07-31 ENCOUNTER — Encounter: Payer: Self-pay | Admitting: Family Medicine

## 2020-08-10 DIAGNOSIS — C61 Malignant neoplasm of prostate: Secondary | ICD-10-CM | POA: Diagnosis not present

## 2020-08-17 DIAGNOSIS — M818 Other osteoporosis without current pathological fracture: Secondary | ICD-10-CM | POA: Diagnosis not present

## 2020-08-17 DIAGNOSIS — C61 Malignant neoplasm of prostate: Secondary | ICD-10-CM | POA: Diagnosis not present

## 2020-08-17 DIAGNOSIS — N393 Stress incontinence (female) (male): Secondary | ICD-10-CM | POA: Diagnosis not present

## 2020-08-17 DIAGNOSIS — C778 Secondary and unspecified malignant neoplasm of lymph nodes of multiple regions: Secondary | ICD-10-CM | POA: Diagnosis not present

## 2020-10-12 ENCOUNTER — Other Ambulatory Visit (INDEPENDENT_AMBULATORY_CARE_PROVIDER_SITE_OTHER): Payer: BC Managed Care – PPO

## 2020-10-12 ENCOUNTER — Other Ambulatory Visit: Payer: Self-pay

## 2020-10-12 DIAGNOSIS — Z23 Encounter for immunization: Secondary | ICD-10-CM | POA: Diagnosis not present

## 2020-10-16 ENCOUNTER — Other Ambulatory Visit: Payer: Self-pay | Admitting: Family Medicine

## 2020-10-16 DIAGNOSIS — C61 Malignant neoplasm of prostate: Secondary | ICD-10-CM

## 2020-10-16 NOTE — Telephone Encounter (Signed)
Refill request for the pts. Alprazolam last apt was 07/28/20 and next apt is 06/14/21.

## 2020-11-11 DIAGNOSIS — C61 Malignant neoplasm of prostate: Secondary | ICD-10-CM | POA: Diagnosis not present

## 2020-11-11 DIAGNOSIS — N5231 Erectile dysfunction following radical prostatectomy: Secondary | ICD-10-CM | POA: Diagnosis not present

## 2020-11-20 DIAGNOSIS — N393 Stress incontinence (female) (male): Secondary | ICD-10-CM | POA: Diagnosis not present

## 2020-11-20 DIAGNOSIS — M818 Other osteoporosis without current pathological fracture: Secondary | ICD-10-CM | POA: Diagnosis not present

## 2020-11-20 DIAGNOSIS — C61 Malignant neoplasm of prostate: Secondary | ICD-10-CM | POA: Diagnosis not present

## 2020-11-20 DIAGNOSIS — C778 Secondary and unspecified malignant neoplasm of lymph nodes of multiple regions: Secondary | ICD-10-CM | POA: Diagnosis not present

## 2020-12-04 DIAGNOSIS — M810 Age-related osteoporosis without current pathological fracture: Secondary | ICD-10-CM | POA: Insufficient documentation

## 2020-12-07 DIAGNOSIS — M810 Age-related osteoporosis without current pathological fracture: Secondary | ICD-10-CM | POA: Diagnosis not present

## 2020-12-14 ENCOUNTER — Other Ambulatory Visit (INDEPENDENT_AMBULATORY_CARE_PROVIDER_SITE_OTHER): Payer: BC Managed Care – PPO

## 2020-12-14 ENCOUNTER — Other Ambulatory Visit: Payer: Self-pay

## 2020-12-14 DIAGNOSIS — Z23 Encounter for immunization: Secondary | ICD-10-CM | POA: Diagnosis not present

## 2020-12-28 ENCOUNTER — Other Ambulatory Visit: Payer: Self-pay

## 2020-12-28 ENCOUNTER — Other Ambulatory Visit (INDEPENDENT_AMBULATORY_CARE_PROVIDER_SITE_OTHER): Payer: BC Managed Care – PPO

## 2020-12-28 DIAGNOSIS — Z23 Encounter for immunization: Secondary | ICD-10-CM | POA: Diagnosis not present

## 2021-01-04 ENCOUNTER — Other Ambulatory Visit: Payer: Self-pay

## 2021-01-04 ENCOUNTER — Ambulatory Visit (INDEPENDENT_AMBULATORY_CARE_PROVIDER_SITE_OTHER): Payer: BC Managed Care – PPO | Admitting: Family Medicine

## 2021-01-04 VITALS — BP 130/82 | HR 87 | Temp 98.2°F | Wt 160.6 lb

## 2021-01-04 DIAGNOSIS — M818 Other osteoporosis without current pathological fracture: Secondary | ICD-10-CM

## 2021-01-04 DIAGNOSIS — C61 Malignant neoplasm of prostate: Secondary | ICD-10-CM | POA: Diagnosis not present

## 2021-01-04 DIAGNOSIS — T387X5A Adverse effect of androgens and anabolic congeners, initial encounter: Secondary | ICD-10-CM | POA: Diagnosis not present

## 2021-01-04 NOTE — Progress Notes (Signed)
   Subjective:    Patient ID: Jeff Alexander, male    DOB: 10/17/1960, 60 y.o.   MRN: [id number]  HPI He has a history of prostate cancer that was diagnosed in September 2019.  The robotic surgery showed spread locally but no metastatic disease to the bones.  He had a biochemical recurrence and was placed on Lupron.Marland Kitchen  He is also on Xtandi.  Bone density was done on him on October 03, 2019 which showed a T score of -2.7.  He was then placed on Prolia.  This was repeated in September of this year which showed a T score of -3.0.  He was then sent by his urologist to me for follow-up and further intervention.  He continues to work and has had no bony pains. He has been taking 3000 international units of vitamin D daily.  He is also taking calcium supplementation.  Review of Systems     Objective:   Physical Exam Alert and in no distress otherwise not examined       Assessment & Plan:  Prostate cancer [facility])  Other osteoporosis without current pathological fracture  Osteoporosis due to androgen therapy Because he has not responded to the Prolia I think it is reasonable to see if we can get him to be q                         ualified for [employer]  Appropriate paperwork will be filled out.

## 2021-01-05 LAB — VITAMIN D 25 HYDROXY (VIT D DEFICIENCY, FRACTURES): Vit D, 25-Hydroxy: 48.6 ng/mL (ref 30.0–100.0)

## 2021-01-11 ENCOUNTER — Other Ambulatory Visit (INDEPENDENT_AMBULATORY_CARE_PROVIDER_SITE_OTHER): Payer: BC Managed Care – PPO

## 2021-01-11 ENCOUNTER — Other Ambulatory Visit: Payer: Self-pay

## 2021-01-11 DIAGNOSIS — H40053 Ocular hypertension, bilateral: Secondary | ICD-10-CM | POA: Diagnosis not present

## 2021-01-11 DIAGNOSIS — Z23 Encounter for immunization: Secondary | ICD-10-CM

## 2021-01-11 DIAGNOSIS — H43813 Vitreous degeneration, bilateral: Secondary | ICD-10-CM | POA: Diagnosis not present

## 2021-01-11 DIAGNOSIS — H2513 Age-related nuclear cataract, bilateral: Secondary | ICD-10-CM | POA: Diagnosis not present

## 2021-01-11 DIAGNOSIS — H0288A Meibomian gland dysfunction right eye, upper and lower eyelids: Secondary | ICD-10-CM | POA: Diagnosis not present

## 2021-01-14 ENCOUNTER — Telehealth: Payer: Self-pay | Admitting: Family Medicine

## 2021-01-14 NOTE — Telephone Encounter (Signed)
Evenity for pt's osteoporosis was denied by [employer]. I called Joycelyn Schmid ,the [place] rep,  to find out what options we had to get approved. She states only approved for women and not really an option for this pt. She states that other options for the patient are Forteo or Tymlos. Which are self administered injectables.

## 2021-01-18 ENCOUNTER — Other Ambulatory Visit: Payer: Self-pay | Admitting: Family Medicine

## 2021-01-18 MED ORDER — FORTEO 600 MCG/2.4ML ~~LOC~~ SOPN: 20.0000 ug | PEN_INJECTOR | Freq: Every day | SUBCUTANEOUS | 2 refills | Status: AC

## 2021-01-18 NOTE — Telephone Encounter (Signed)
Will send in Forteo and see if covered

## 2021-01-19 NOTE — Telephone Encounter (Signed)
Received rejection Forteo needs P.A. Called plan t# [phone] Forteo not covered at local pharmacy, must call into [facility] t# [phone].  Pharmacy benefits is [employer] t# [phone].  Options are  Forteo not covered, Evenity is only for females Teriparatide (Forteo generic) P.A. not required Prolia tried and failed Tymlos not covered   Called generic Forteo into [facility], they will call back if P.A. required or if any issues.

## 2021-01-27 ENCOUNTER — Telehealth: Payer: Self-pay

## 2021-01-27 NOTE — Telephone Encounter (Signed)
[employer] support  called concerning pt gender. Spoke to [place] makes evenity med that the pt is either on or will be on. Info was given. Jeff Alexander

## 2021-02-02 ENCOUNTER — Telehealth: Payer: Self-pay

## 2021-02-02 DIAGNOSIS — C61 Malignant neoplasm of prostate: Secondary | ICD-10-CM | POA: Diagnosis not present

## 2021-02-02 NOTE — Telephone Encounter (Signed)
P.A. TERIPARATIDE (FORTEO) denied, states must have reason brand Forteo and Tymlos.  Called and switched to brand Forteo, they will process and see if it requires P.A.

## 2021-02-02 NOTE — Telephone Encounter (Signed)
P.A. TERIPARATIDE (FORTEO)

## 2021-02-03 NOTE — Telephone Encounter (Signed)
Called [employer] t# Brand Forteo also required P.A, this was completed

## 2021-02-09 DIAGNOSIS — C778 Secondary and unspecified malignant neoplasm of lymph nodes of multiple regions: Secondary | ICD-10-CM | POA: Diagnosis not present

## 2021-02-09 DIAGNOSIS — M818 Other osteoporosis without current pathological fracture: Secondary | ICD-10-CM | POA: Diagnosis not present

## 2021-02-09 DIAGNOSIS — N393 Stress incontinence (female) (male): Secondary | ICD-10-CM | POA: Diagnosis not present

## 2021-02-09 DIAGNOSIS — C61 Malignant neoplasm of prostate: Secondary | ICD-10-CM | POA: Diagnosis not present

## 2021-02-12 NOTE — Telephone Encounter (Signed)
Marca Ancona Forteo was approved til 02/02/22

## 2021-02-12 NOTE — Telephone Encounter (Signed)
Working on prior authorizations

## 2021-02-17 ENCOUNTER — Other Ambulatory Visit: Payer: Self-pay

## 2021-02-17 ENCOUNTER — Ambulatory Visit (INDEPENDENT_AMBULATORY_CARE_PROVIDER_SITE_OTHER): Payer: BC Managed Care – PPO | Admitting: Family Medicine

## 2021-02-17 ENCOUNTER — Telehealth: Payer: Self-pay | Admitting: Family Medicine

## 2021-02-17 ENCOUNTER — Encounter: Payer: Self-pay | Admitting: Family Medicine

## 2021-02-17 VITALS — BP 130/80 | HR 73 | Temp 98.2°F | Wt 160.8 lb

## 2021-02-17 DIAGNOSIS — T387X5A Adverse effect of androgens and anabolic congeners, initial encounter: Secondary | ICD-10-CM

## 2021-02-17 DIAGNOSIS — M818 Other osteoporosis without current pathological fracture: Secondary | ICD-10-CM

## 2021-02-17 DIAGNOSIS — M2550 Pain in unspecified joint: Secondary | ICD-10-CM | POA: Diagnosis not present

## 2021-02-17 NOTE — Telephone Encounter (Signed)
Dismissal letter in guarantor snapshot  °

## 2021-02-17 NOTE — Telephone Encounter (Signed)
[facility] t# [phone] and was delivered to patient 02/09/21 $0 co pay Called pt and he did receive and has been using Forteo, pt is coming in this afternoon for appt to discuss.

## 2021-02-17 NOTE — Progress Notes (Signed)
   Subjective:    Patient ID: Jeff Alexander, male    DOB: 1961-03-17, 60 y.o.   MRN: [id number]  HPI He is here for consult concerning difficulty with foot pain however further history indicates he is having joint aches and pains that he days to when he started taking Forteo approximately 1 week ago.  It can be fleeting and go to multiple joints but the main ways have difficult now that he is the right second toe.   Review of Systems     Objective:   Physical Exam Alert and in no distress.  Exam of the foot shows no swelling or erythema and only minimal tenderness over the second MTP joint.  No pain over the first MTP joint.       Assessment & Plan:  Arthralgia of multiple sites, bilateral  Osteoporosis due to androgen therapy I reassured him that I did not think this was gout. I explained that hopefully the arthralgias will go away with time.  Explained the fact that sometimes initial side effects of medications to go away with time.  Encouraged him to continue with his vitamin D and calcium supplementation.  Recommend Tylenol for any aches and pains.  He was comfortable with this.

## 2021-02-22 NOTE — Telephone Encounter (Signed)
error 

## 2021-03-01 ENCOUNTER — Telehealth: Payer: Self-pay

## 2021-03-01 NOTE — Telephone Encounter (Signed)
Brand Forteo was approved. Followed back up with pt he did receive the Forteo for $0 co pay and going well with no issues

## 2021-03-01 NOTE — Telephone Encounter (Signed)
Followed back up with pt he did receive the Forteo for $0 co pay and going well with no issues

## 2021-04-05 ENCOUNTER — Telehealth: Payer: Self-pay

## 2021-04-05 DIAGNOSIS — M2550 Pain in unspecified joint: Secondary | ICD-10-CM

## 2021-04-05 DIAGNOSIS — M818 Other osteoporosis without current pathological fracture: Secondary | ICD-10-CM

## 2021-04-05 MED ORDER — FORTEO 600 MCG/2.4ML ~~LOC~~ SOPN: PEN_INJECTOR | SUBCUTANEOUS | 3 refills | Status: DC

## 2021-04-05 NOTE — Telephone Encounter (Signed)
Patient Left VM stating he is needing a new PA on brand Forteo, reviewed chart may just need refill, last rx wrote on 03/01/21 states to take daily for 28 days. Should patient continue mediation or was this for 28 days only? RX no longer on medication list due to being for 28 days, med will need to be added to list and refilled if he is to continue then will receive PA request from pharmacy

## 2021-04-05 NOTE — Telephone Encounter (Signed)
Rx sent in

## 2021-04-29 ENCOUNTER — Other Ambulatory Visit: Payer: Self-pay | Admitting: Family Medicine

## 2021-04-29 DIAGNOSIS — M818 Other osteoporosis without current pathological fracture: Secondary | ICD-10-CM

## 2021-04-29 DIAGNOSIS — T387X5A Adverse effect of androgens and anabolic congeners, initial encounter: Secondary | ICD-10-CM

## 2021-04-29 DIAGNOSIS — M2550 Pain in unspecified joint: Secondary | ICD-10-CM

## 2021-05-11 DIAGNOSIS — C61 Malignant neoplasm of prostate: Secondary | ICD-10-CM | POA: Diagnosis not present

## 2021-05-11 LAB — PSA: PSA: <0.015

## 2021-05-11 LAB — VITAMIN D 25 HYDROXY (VIT D DEFICIENCY, FRACTURES): Vit D, 25-Hydroxy: 40.3

## 2021-05-18 DIAGNOSIS — N393 Stress incontinence (female) (male): Secondary | ICD-10-CM | POA: Diagnosis not present

## 2021-05-18 DIAGNOSIS — N5231 Erectile dysfunction following radical prostatectomy: Secondary | ICD-10-CM | POA: Diagnosis not present

## 2021-05-18 DIAGNOSIS — M818 Other osteoporosis without current pathological fracture: Secondary | ICD-10-CM | POA: Diagnosis not present

## 2021-05-18 DIAGNOSIS — C61 Malignant neoplasm of prostate: Secondary | ICD-10-CM | POA: Diagnosis not present

## 2021-06-14 ENCOUNTER — Ambulatory Visit (INDEPENDENT_AMBULATORY_CARE_PROVIDER_SITE_OTHER): Payer: BC Managed Care – PPO | Admitting: Family Medicine

## 2021-06-14 ENCOUNTER — Other Ambulatory Visit: Payer: Self-pay

## 2021-06-14 ENCOUNTER — Encounter: Payer: Self-pay | Admitting: Family Medicine

## 2021-06-14 VITALS — BP 130/80 | HR 85 | Temp 97.0°F | Ht 65.0 in | Wt 154.4 lb

## 2021-06-14 DIAGNOSIS — J309 Allergic rhinitis, unspecified: Secondary | ICD-10-CM | POA: Diagnosis not present

## 2021-06-14 DIAGNOSIS — C61 Malignant neoplasm of prostate: Secondary | ICD-10-CM | POA: Diagnosis not present

## 2021-06-14 DIAGNOSIS — E785 Hyperlipidemia, unspecified: Secondary | ICD-10-CM

## 2021-06-14 DIAGNOSIS — N393 Stress incontinence (female) (male): Secondary | ICD-10-CM

## 2021-06-14 DIAGNOSIS — M818 Other osteoporosis without current pathological fracture: Secondary | ICD-10-CM

## 2021-06-14 DIAGNOSIS — T387X5A Adverse effect of androgens and anabolic congeners, initial encounter: Secondary | ICD-10-CM

## 2021-06-14 DIAGNOSIS — Z0000 Encounter for general adult medical examination without abnormal findings: Secondary | ICD-10-CM | POA: Diagnosis not present

## 2021-06-14 DIAGNOSIS — Z8619 Personal history of other infectious and parasitic diseases: Secondary | ICD-10-CM

## 2021-06-14 DIAGNOSIS — R7301 Impaired fasting glucose: Secondary | ICD-10-CM | POA: Diagnosis not present

## 2021-06-14 LAB — CBC WITH DIFFERENTIAL/PLATELET
Basophils Absolute: 0 10*3/uL (ref 0.0–0.2)
Basos: 0 %
EOS (ABSOLUTE): 0.1 10*3/uL (ref 0.0–0.4)
Eos: 2 %
Hematocrit: 40.9 % (ref 37.5–51.0)
Hemoglobin: 14.1 g/dL (ref 13.0–17.7)
Immature Grans (Abs): 0 10*3/uL (ref 0.0–0.1)
Immature Granulocytes: 0 %
Lymphocytes Absolute: 1.3 10*3/uL (ref 0.7–3.1)
Lymphs: 21 %
MCH: 30.7 pg (ref 26.6–33.0)
MCHC: 34.5 g/dL (ref 31.5–35.7)
MCV: 89 fL (ref 79–97)
Monocytes Absolute: 0.3 10*3/uL (ref 0.1–0.9)
Monocytes: 5 %
Neutrophils Absolute: 4.4 10*3/uL (ref 1.4–7.0)
Neutrophils: 72 %
Platelets: 321 10*3/uL (ref 150–450)
RBC: 4.59 x10E6/uL (ref 4.14–5.80)
RDW: 13 % (ref 11.6–15.4)
WBC: 6.2 10*3/uL (ref 3.4–10.8)

## 2021-06-14 LAB — LIPID PANEL
Chol/HDL Ratio: 4.2 ratio (ref 0.0–5.0)
Cholesterol, Total: 246 mg/dL — ABNORMAL HIGH (ref 100–199)
HDL: 58 mg/dL (ref >39)
LDL Chol Calc (NIH): 162 mg/dL — ABNORMAL HIGH (ref 0–99)
Triglycerides: 147 mg/dL (ref 0–149)
VLDL Cholesterol Cal: 26 mg/dL (ref 5–40)

## 2021-06-14 LAB — COMPREHENSIVE METABOLIC PANEL
ALT: 8 IU/L (ref 0–44)
AST: 10 IU/L (ref 0–40)
Albumin/Globulin Ratio: 1.7 (ref 1.2–2.2)
Albumin: 4.3 g/dL (ref 3.8–4.9)
Alkaline Phosphatase: 117 IU/L (ref 44–121)
BUN/Creatinine Ratio: 13 (ref 10–24)
BUN: 14 mg/dL (ref 8–27)
Bilirubin Total: 0.4 mg/dL (ref 0.0–1.2)
CO2: 24 mmol/L (ref 20–29)
Calcium: 10.5 mg/dL — ABNORMAL HIGH (ref 8.6–10.2)
Chloride: 103 mmol/L (ref 96–106)
Creatinine, Ser: 1.07 mg/dL (ref 0.76–1.27)
Globulin, Total: 2.5 g/dL (ref 1.5–4.5)
Glucose: 120 mg/dL — ABNORMAL HIGH (ref 70–99)
Potassium: 4.7 mmol/L (ref 3.5–5.2)
Sodium: 140 mmol/L (ref 134–144)
Total Protein: 6.8 g/dL (ref 6.0–8.5)
eGFR: 79 mL/min/{1.73_m2} (ref >59)

## 2021-06-14 NOTE — Progress Notes (Signed)
? ?  Subjective:  ? ? Patient ID: Jeff Alexander, male    DOB: May 01, 1960, 61 y.o.   MRN: [id number] ? ?HPI ?He is here for complete examination.  He continues to be followed by urology for his underlying prostate cancer.  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  He is on low-dose venlafaxine at 25 mg which seems to help with his hot flashes.  He is also taking extra vitamin D and calcium.  He apparently did have these numbers checked by urology recently.  He has been on Forteo for several months.  He has had some intermittent aches and pains with this but otherwise no other issues.  Rarely uses Xanax.  He no longer has erections and does have some urinary leakage and is using padding.  He seems to be comfortable with this.  His allergies cause very little difficulty mainly with rhinorrhea and he does treat it with an antihistamine.  He does have a history of hyperlipidemia.  He seems to have a fairly good handle on this psychologically.  Otherwise his family and social history as well as health maintenance and immunizations was reviewed. ? ? ?Review of Systems  ?All other systems reviewed and are negative. ? ?   ?Objective:  ? Physical Exam ?Alert and in no distress. Tympanic membranes and canals are normal. Pharyngeal area is normal. Neck is supple without adenopathy or thyromegaly. Cardiac exam shows a regular sinus rhythm without murmurs or gallops. Lungs are clear to auscultation. ? ? ? ? ?   ?Assessment & Plan:  ?Routine general medical examination at a health care facility ? ?Allergic rhinitis, unspecified seasonality, unspecified trigger ? ?Osteoporosis due to androgen therapy ? ?Prostate cancer ([place]) ? ?History of herpes labialis ? ?History of shingles ? ?Stress incontinence, male ? ?Hyperlipidemia, unspecified hyperlipidemia type ?He will continue on present medication regimen.  Follow-up here as needed. ? ?

## 2021-06-16 DIAGNOSIS — R7303 Prediabetes: Secondary | ICD-10-CM | POA: Insufficient documentation

## 2021-06-16 LAB — HGB A1C W/O EAG: Hgb A1c MFr Bld: 6 % — ABNORMAL HIGH (ref 4.8–5.6)

## 2021-06-16 LAB — SPECIMEN STATUS REPORT

## 2021-07-26 ENCOUNTER — Other Ambulatory Visit: Payer: Self-pay | Admitting: Family Medicine

## 2021-07-26 DIAGNOSIS — M2550 Pain in unspecified joint: Secondary | ICD-10-CM

## 2021-07-26 DIAGNOSIS — M818 Other osteoporosis without current pathological fracture: Secondary | ICD-10-CM

## 2021-08-09 DIAGNOSIS — M818 Other osteoporosis without current pathological fracture: Secondary | ICD-10-CM | POA: Diagnosis not present

## 2021-08-09 DIAGNOSIS — C61 Malignant neoplasm of prostate: Secondary | ICD-10-CM | POA: Diagnosis not present

## 2021-08-16 DIAGNOSIS — C61 Malignant neoplasm of prostate: Secondary | ICD-10-CM | POA: Diagnosis not present

## 2021-08-16 DIAGNOSIS — M818 Other osteoporosis without current pathological fracture: Secondary | ICD-10-CM | POA: Diagnosis not present

## 2021-08-16 DIAGNOSIS — C778 Secondary and unspecified malignant neoplasm of lymph nodes of multiple regions: Secondary | ICD-10-CM | POA: Diagnosis not present

## 2021-08-16 DIAGNOSIS — N393 Stress incontinence (female) (male): Secondary | ICD-10-CM | POA: Diagnosis not present

## 2021-08-18 ENCOUNTER — Other Ambulatory Visit: Payer: Self-pay | Admitting: Family Medicine

## 2021-08-18 DIAGNOSIS — M818 Other osteoporosis without current pathological fracture: Secondary | ICD-10-CM

## 2021-08-18 DIAGNOSIS — M2550 Pain in unspecified joint: Secondary | ICD-10-CM

## 2021-08-19 NOTE — Telephone Encounter (Signed)
Is this okay to refill? 

## 2021-09-13 ENCOUNTER — Encounter: Payer: Self-pay | Admitting: Family Medicine

## 2021-09-13 ENCOUNTER — Ambulatory Visit (INDEPENDENT_AMBULATORY_CARE_PROVIDER_SITE_OTHER): Payer: BC Managed Care – PPO | Admitting: Family Medicine

## 2021-09-13 VITALS — BP 120/70 | HR 83 | Temp 98.1°F | Resp 99 | Wt 151.6 lb

## 2021-09-13 DIAGNOSIS — W57XXXA Bitten or stung by nonvenomous insect and other nonvenomous arthropods, initial encounter: Secondary | ICD-10-CM

## 2021-09-13 DIAGNOSIS — S80869A Insect bite (nonvenomous), unspecified lower leg, initial encounter: Secondary | ICD-10-CM

## 2021-09-13 NOTE — Progress Notes (Signed)
   Subjective:    Patient ID: Jeff Alexander, male    DOB: 1960-11-28, 61 y.o.   MRN: [id number]  HPI He states that last Friday he went walking in the fields and later that day pulled off 3 ticks attached to his legs.  They were attached but no blood meal was obtained.   Review of Systems     Objective:   Physical Exam Alert and in no distress.  3 lesions are noted on his legs 2 of which are slightly erythematous.  They were marked with an ink pen.       Assessment & Plan:  Tick bite of lower leg, unspecified laterality, initial encounter I explained that the ticks need to be on at least 24 to 48 hours and get a blood meal for there was any danger.  The main risk would be a local infection.  The lesions were outlined with an ink pen and he will watch these.  If they grow he will call me.

## 2021-10-05 ENCOUNTER — Encounter: Payer: Self-pay | Admitting: Family Medicine

## 2021-11-08 DIAGNOSIS — M818 Other osteoporosis without current pathological fracture: Secondary | ICD-10-CM | POA: Diagnosis not present

## 2021-11-08 DIAGNOSIS — C61 Malignant neoplasm of prostate: Secondary | ICD-10-CM | POA: Diagnosis not present

## 2021-11-08 LAB — PSA: PSA: <0.015

## 2021-11-15 DIAGNOSIS — N393 Stress incontinence (female) (male): Secondary | ICD-10-CM | POA: Diagnosis not present

## 2021-11-15 DIAGNOSIS — C61 Malignant neoplasm of prostate: Secondary | ICD-10-CM | POA: Diagnosis not present

## 2021-11-17 ENCOUNTER — Other Ambulatory Visit: Payer: Self-pay | Admitting: Urology

## 2021-11-17 DIAGNOSIS — M810 Age-related osteoporosis without current pathological fracture: Secondary | ICD-10-CM

## 2021-11-17 DIAGNOSIS — Z5111 Encounter for antineoplastic chemotherapy: Secondary | ICD-10-CM | POA: Diagnosis not present

## 2021-11-17 DIAGNOSIS — C61 Malignant neoplasm of prostate: Secondary | ICD-10-CM | POA: Diagnosis not present

## 2021-12-01 ENCOUNTER — Encounter: Payer: Self-pay | Admitting: Internal Medicine

## 2021-12-13 ENCOUNTER — Other Ambulatory Visit: Payer: Self-pay | Admitting: Family Medicine

## 2021-12-13 DIAGNOSIS — M2550 Pain in unspecified joint: Secondary | ICD-10-CM

## 2021-12-13 DIAGNOSIS — M818 Other osteoporosis without current pathological fracture: Secondary | ICD-10-CM

## 2022-01-04 ENCOUNTER — Encounter: Payer: Self-pay | Admitting: Internal Medicine

## 2022-01-11 ENCOUNTER — Other Ambulatory Visit: Payer: Self-pay | Admitting: Family Medicine

## 2022-01-11 DIAGNOSIS — M2550 Pain in unspecified joint: Secondary | ICD-10-CM

## 2022-01-11 DIAGNOSIS — M818 Other osteoporosis without current pathological fracture: Secondary | ICD-10-CM

## 2022-01-13 DIAGNOSIS — H0288A Meibomian gland dysfunction right eye, upper and lower eyelids: Secondary | ICD-10-CM | POA: Diagnosis not present

## 2022-01-13 DIAGNOSIS — H40053 Ocular hypertension, bilateral: Secondary | ICD-10-CM | POA: Diagnosis not present

## 2022-01-13 DIAGNOSIS — H2513 Age-related nuclear cataract, bilateral: Secondary | ICD-10-CM | POA: Diagnosis not present

## 2022-01-13 DIAGNOSIS — H43813 Vitreous degeneration, bilateral: Secondary | ICD-10-CM | POA: Diagnosis not present

## 2022-01-17 ENCOUNTER — Other Ambulatory Visit (INDEPENDENT_AMBULATORY_CARE_PROVIDER_SITE_OTHER): Payer: BC Managed Care – PPO

## 2022-01-17 ENCOUNTER — Encounter: Payer: Self-pay | Admitting: Internal Medicine

## 2022-01-17 DIAGNOSIS — Z23 Encounter for immunization: Secondary | ICD-10-CM

## 2022-02-14 DIAGNOSIS — C61 Malignant neoplasm of prostate: Secondary | ICD-10-CM | POA: Diagnosis not present

## 2022-02-21 DIAGNOSIS — C61 Malignant neoplasm of prostate: Secondary | ICD-10-CM | POA: Diagnosis not present

## 2022-02-21 DIAGNOSIS — N393 Stress incontinence (female) (male): Secondary | ICD-10-CM | POA: Diagnosis not present

## 2022-02-21 DIAGNOSIS — C778 Secondary and unspecified malignant neoplasm of lymph nodes of multiple regions: Secondary | ICD-10-CM | POA: Diagnosis not present

## 2022-04-18 ENCOUNTER — Other Ambulatory Visit: Payer: Self-pay | Admitting: Urology

## 2022-04-18 DIAGNOSIS — M810 Age-related osteoporosis without current pathological fracture: Secondary | ICD-10-CM

## 2022-05-05 ENCOUNTER — Ambulatory Visit
Admission: RE | Admit: 2022-05-05 | Discharge: 2022-05-05 | Disposition: A | Discharge status: Home / Self Care | Payer: BC Managed Care – PPO | Source: Ambulatory Visit | Attending: Urology | Admitting: Urology

## 2022-05-05 DIAGNOSIS — M810 Age-related osteoporosis without current pathological fracture: Secondary | ICD-10-CM | POA: Diagnosis not present

## 2022-05-19 DIAGNOSIS — C61 Malignant neoplasm of prostate: Secondary | ICD-10-CM | POA: Diagnosis not present

## 2022-05-19 DIAGNOSIS — C778 Secondary and unspecified malignant neoplasm of lymph nodes of multiple regions: Secondary | ICD-10-CM | POA: Diagnosis not present

## 2022-05-19 DIAGNOSIS — E291 Testicular hypofunction: Secondary | ICD-10-CM | POA: Diagnosis not present

## 2022-05-22 ENCOUNTER — Telehealth: Payer: Self-pay | Admitting: Family Medicine

## 2022-05-22 NOTE — Telephone Encounter (Signed)
P.A. [facility]) renewal completed

## 2022-05-26 DIAGNOSIS — C61 Malignant neoplasm of prostate: Secondary | ICD-10-CM | POA: Diagnosis not present

## 2022-05-28 NOTE — Telephone Encounter (Signed)
P.A. approved til 02/03/23

## 2022-06-13 ENCOUNTER — Telehealth: Payer: Self-pay

## 2022-06-13 ENCOUNTER — Telehealth: Payer: Self-pay | Admitting: Family Medicine

## 2022-06-13 DIAGNOSIS — M818 Other osteoporosis without current pathological fracture: Secondary | ICD-10-CM

## 2022-06-13 DIAGNOSIS — M2550 Pain in unspecified joint: Secondary | ICD-10-CM

## 2022-06-13 MED ORDER — TERIPARATIDE 600 MCG/2.4ML ~~LOC~~ SOPN: PEN_INJECTOR | SUBCUTANEOUS | 5 refills | Status: DC

## 2022-06-13 NOTE — Telephone Encounter (Signed)
Pt called states [facility] having issues with co pay card for Forteo, printed another card.  I called [facility] [phone], they state no issue with co pay card, pt just needs refills.  Please refill Forteo, he has CPE appt next week.   Called pt and informed

## 2022-06-13 NOTE — Telephone Encounter (Signed)
Received refill request from [facility] for teriparatide 62mcg/2.4ml. Last sent on 01/11/22 with 5 refills.

## 2022-06-20 ENCOUNTER — Encounter: Payer: Self-pay | Admitting: Family Medicine

## 2022-06-20 ENCOUNTER — Ambulatory Visit (INDEPENDENT_AMBULATORY_CARE_PROVIDER_SITE_OTHER): Payer: BC Managed Care – PPO | Admitting: Family Medicine

## 2022-06-20 VITALS — BP 150/88 | HR 68 | Temp 98.3°F | Resp 16 | Ht 65.75 in | Wt 154.0 lb

## 2022-06-20 DIAGNOSIS — Z8619 Personal history of other infectious and parasitic diseases: Secondary | ICD-10-CM | POA: Diagnosis not present

## 2022-06-20 DIAGNOSIS — Z0000 Encounter for general adult medical examination without abnormal findings: Secondary | ICD-10-CM

## 2022-06-20 DIAGNOSIS — J309 Allergic rhinitis, unspecified: Secondary | ICD-10-CM

## 2022-06-20 DIAGNOSIS — C61 Malignant neoplasm of prostate: Secondary | ICD-10-CM | POA: Diagnosis not present

## 2022-06-20 DIAGNOSIS — N393 Stress incontinence (female) (male): Secondary | ICD-10-CM

## 2022-06-20 DIAGNOSIS — M818 Other osteoporosis without current pathological fracture: Secondary | ICD-10-CM | POA: Diagnosis not present

## 2022-06-20 DIAGNOSIS — R7303 Prediabetes: Secondary | ICD-10-CM | POA: Diagnosis not present

## 2022-06-20 DIAGNOSIS — T387X5A Adverse effect of androgens and anabolic congeners, initial encounter: Secondary | ICD-10-CM

## 2022-06-20 DIAGNOSIS — E785 Hyperlipidemia, unspecified: Secondary | ICD-10-CM | POA: Diagnosis not present

## 2022-06-20 LAB — POCT URINALYSIS DIP (PROADVANTAGE DEVICE)
Bilirubin, UA: NEGATIVE
Blood, UA: NEGATIVE
Glucose, UA: NEGATIVE mg/dL
Ketones, POC UA: NEGATIVE mg/dL
Leukocytes, UA: NEGATIVE
Nitrite, UA: NEGATIVE
Protein Ur, POC: NEGATIVE mg/dL
Specific Gravity, Urine: 1.01
Urobilinogen, Ur: 0.2
pH, UA: 6 (ref 5.0–8.0)

## 2022-06-20 LAB — POCT GLYCOSYLATED HEMOGLOBIN (HGB A1C): Hemoglobin A1C: 6.3 % — AB (ref 4.0–5.6)

## 2022-06-20 NOTE — Patient Instructions (Signed)
Check out the DASH diet 

## 2022-06-20 NOTE — Progress Notes (Addendum)
Complete physical exam  Patient: Jeff Alexander   DOB: 26-Oct-1960   62 y.o. Male  MRN: [id number]  Subjective:    Chief Complaint  Patient presents with   Annual Exam    Fasting. No additional concerns.     Jeff Alexander is a 62 y.o. male who presents today for a complete physical exam. He reports consuming a general diet. Home exercise routine includes walking 3-4 time a week. He generally feels fairly well. He reports sleeping fairly well.  He is still having difficulty with hot flashes and his urologist offered him a new medication however he is holding off for now.  He still does have occasional difficulty with incontinence.  His blood work at the urology office did show normal vitamin D levels.  His allergies seem to be under good control with OTC medications.  He has not had any difficulty with herpes labialis.  He does have a history of prediabetes.  He continues on Forteo.  He does not have additional problems to discuss today.  Otherwise his family and social history as well as health maintenance and immunizations was reviewed.   Most recent fall risk assessment:    06/20/2022    9:23 AM  [place] in the past year? 0  Number falls in past yr: 0  Injury with Fall? 0  Risk for fall due to : No Fall Risks  Follow up Falls evaluation completed     Most recent depression screenings:    06/20/2022    9:23 AM 06/14/2021    9:26 AM  PHQ 2/9 Scores  PHQ - 2 Score 0 0    Vision:Within last year    Patient Care Team: Denita Lung, MD as PCP - General (Family Medicine)   Outpatient Medications Prior to Visit  Medication Sig Note   ALPRAZolam (XANAX) 0.25 MG tablet TAKE (1) TABLET TWICE DAILY AS NEEDED FOR ANXIETY. 06/20/2022: Prn. Last used 3-4 months ago.    Calcium Acetate, Phos Binder, (CALCIUM ACETATE PO) Take by mouth.    Enzalutamide (XTANDI PO) Take by mouth.    Injection Device MISC by Does not apply route. Eliguard injection    Leuprolide Acetate, 3  Month, (ELIGARD) 22.5 MG injection Inject 22.5 mg into the skin every 3 (three) months.    loratadine (CLARITIN) 10 MG tablet Take 10 mg by mouth daily as needed for allergies.    Teriparatide, Recombinant, (FORTEO) 600 MCG/2.4ML SOPN Inject 31mcg [place] daily    venlafaxine XR (EFFEXOR-XR) 75 MG 24 hr capsule TAKE 1 CAPSULE IN THE MORNING WITH BREAKFAST. (Patient taking differently: 100 mg.)    VITAMIN D PO Take by mouth.    No facility-administered medications prior to visit.    Review of Systems  All other systems reviewed and are negative.         Objective:     BP (!) 150/88 (BP Location: Left Arm, Cuff Size: Large)   Pulse 68   Temp 98.3 F (36.8 C) (Oral)   Resp 16   Ht 5' 5.75" (1.67 m)   Wt 154 lb (69.9 kg)   SpO2 97% Comment: room air  BMI 25.05 kg/m    Physical Exam  Alert and in no distress. Tympanic membranes and canals are normal. Pharyngeal area is normal. Neck is supple without adenopathy or thyromegaly. Cardiac exam shows a regular sinus rhythm without murmurs or gallops. Lungs are clear to auscultation. Hemoglobin A1c 6.3 Results for orders placed  or performed in visit on 06/20/22  POCT glycosylated hemoglobin (Hb A1C)  Result Value Ref Range   Hemoglobin A1C 6.3 (A) 4.0 - 5.6 %       Assessment & Plan:     Routine general medical examination at a health care facility - Plan: CBC with Differential/Platelet, Comprehensive metabolic panel, Lipid panel, POCT Urinalysis DIP (Proadvantage Device)  Allergic rhinitis, unspecified seasonality, unspecified trigger  Osteoporosis due to androgen therapy  Prostate cancer (HCC)  History of herpes labialis  Hyperlipidemia, unspecified hyperlipidemia type - Plan: Lipid panel  Prediabetes - Plan: POCT glycosylated hemoglobin (Hb A1C)  Stress incontinence, male  Immunization History  Administered Date(s) Administered   COVID-19, mRNA, vaccine(Comirnaty)12 years and older 01/17/2022   Influenza,inj,Quad  PF,6+ Mos 02/16/2018, 12/06/2018, 12/28/2020, 01/17/2022   Influenza-Unspecified 02/16/2018   PFIZER(Purple Top)SARS-COV-2 Vaccination 06/01/2019, 07/04/2019, 02/06/2020   Pfizer Covid-19 Vaccine Bivalent Booster 25yrs & up 01/11/2021   Tdap 09/14/2009, 07/11/2017   Zoster Recombinat (Shingrix) 10/12/2020, 12/14/2020    Health Maintenance  Topic Date Due   COVID-19 Vaccine (6 - 2023-24 season) 03/14/2022   Fecal DNA (Cologuard)  07/17/2023   DTaP/Tdap/Td (3 - Td or Tdap) 07/12/2027   INFLUENZA VACCINE  Completed   Hepatitis C Screening  Completed   HIV Screening  Completed   Zoster Vaccines- Shingrix  Completed   HPV VACCINES  Aged Out   COLONOSCOPY (Pts 45-22yrs Insurance coverage will need to be confirmed)  Discontinued    Discussed health benefits of physical activity, and encouraged him to engage in regular exercise appropriate for his age and condition.  Discussed prediabetes diagnosis with him and encouraged continued diet and exercise.  Also discussed allergy care and he is doing well with that.  He will continue on his Forteo and follow-up with urology.  Problem List Items Addressed This Visit       Respiratory   Allergic rhinitis     Musculoskeletal and Integument   Osteoporosis due to androgen therapy     Genitourinary   Prostate cancer (HCC)     Other   History of herpes labialis   Hyperlipidemia   Prediabetes   Relevant Orders   POCT glycosylated hemoglobin (Hb A1C) (Completed)   Stress incontinence, male   Other Visit Diagnoses     Routine general medical examination at a health care facility    -  Primary   Relevant Orders   POCT Urinalysis DIP (Proadvantage Device)      Follow up one month     Sharlot Gowda MD

## 2022-06-21 LAB — CBC WITH DIFFERENTIAL/PLATELET
Basophils Absolute: 0 10*3/uL (ref 0.0–0.2)
Basos: 1 %
EOS (ABSOLUTE): 0.2 10*3/uL (ref 0.0–0.4)
Eos: 2 %
Hematocrit: 39.1 % (ref 37.5–51.0)
Hemoglobin: 13.4 g/dL (ref 13.0–17.7)
Immature Grans (Abs): 0 10*3/uL (ref 0.0–0.1)
Immature Granulocytes: 0 %
Lymphocytes Absolute: 1.6 10*3/uL (ref 0.7–3.1)
Lymphs: 26 %
MCH: 30.8 pg (ref 26.6–33.0)
MCHC: 34.3 g/dL (ref 31.5–35.7)
MCV: 90 fL (ref 79–97)
Monocytes Absolute: 0.4 10*3/uL (ref 0.1–0.9)
Monocytes: 7 %
Neutrophils Absolute: 4 10*3/uL (ref 1.4–7.0)
Neutrophils: 64 %
Platelets: 288 10*3/uL (ref 150–450)
RBC: 4.35 x10E6/uL (ref 4.14–5.80)
RDW: 12.8 % (ref 11.6–15.4)
WBC: 6.2 10*3/uL (ref 3.4–10.8)

## 2022-06-21 LAB — LIPID PANEL
Chol/HDL Ratio: 4.2 ratio (ref 0.0–5.0)
Cholesterol, Total: 242 mg/dL — ABNORMAL HIGH (ref 100–199)
HDL: 58 mg/dL (ref >39)
LDL Chol Calc (NIH): 153 mg/dL — ABNORMAL HIGH (ref 0–99)
Triglycerides: 171 mg/dL — ABNORMAL HIGH (ref 0–149)
VLDL Cholesterol Cal: 31 mg/dL (ref 5–40)

## 2022-06-21 LAB — COMPREHENSIVE METABOLIC PANEL
ALT: 11 IU/L (ref 0–44)
AST: 11 IU/L (ref 0–40)
Albumin/Globulin Ratio: 1.9 (ref 1.2–2.2)
Albumin: 4.3 g/dL (ref 3.9–4.9)
Alkaline Phosphatase: 149 IU/L — ABNORMAL HIGH (ref 44–121)
BUN/Creatinine Ratio: 15 (ref 10–24)
BUN: 13 mg/dL (ref 8–27)
Bilirubin Total: 0.3 mg/dL (ref 0.0–1.2)
CO2: 23 mmol/L (ref 20–29)
Calcium: 9.9 mg/dL (ref 8.6–10.2)
Chloride: 100 mmol/L (ref 96–106)
Creatinine, Ser: 0.87 mg/dL (ref 0.76–1.27)
Globulin, Total: 2.3 g/dL (ref 1.5–4.5)
Glucose: 113 mg/dL — ABNORMAL HIGH (ref 70–99)
Potassium: 4.9 mmol/L (ref 3.5–5.2)
Sodium: 137 mmol/L (ref 134–144)
Total Protein: 6.6 g/dL (ref 6.0–8.5)
eGFR: 98 mL/min/{1.73_m2} (ref >59)

## 2022-06-22 ENCOUNTER — Other Ambulatory Visit (HOSPITAL_COMMUNITY): Payer: Self-pay

## 2022-06-24 ENCOUNTER — Other Ambulatory Visit (HOSPITAL_COMMUNITY): Payer: Self-pay

## 2022-06-25 ENCOUNTER — Other Ambulatory Visit (HOSPITAL_COMMUNITY): Payer: Self-pay

## 2022-06-27 ENCOUNTER — Other Ambulatory Visit (HOSPITAL_COMMUNITY): Payer: Self-pay

## 2022-06-27 ENCOUNTER — Other Ambulatory Visit: Payer: Self-pay

## 2022-06-27 MED ORDER — TERIPARATIDE 600 MCG/2.4ML ~~LOC~~ SOPN
PEN_INJECTOR | SUBCUTANEOUS | 5 refills | Status: DC
  Filled 2022-06-27 – 2022-07-01 (×2): qty 2.4, 28d supply, fill #0
  Filled 2022-07-12: qty 2.4, 30d supply, fill #0
  Filled 2022-07-28: qty 2.4, 30d supply, fill #1
  Filled 2022-08-24: qty 2.4, 30d supply, fill #2
  Filled 2022-09-23: qty 2.4, 30d supply, fill #3
  Filled 2022-10-21: qty 2.4, 30d supply, fill #4
  Filled 2022-11-16: qty 2.4, 30d supply, fill #5

## 2022-07-01 ENCOUNTER — Other Ambulatory Visit: Payer: Self-pay

## 2022-07-01 ENCOUNTER — Other Ambulatory Visit (HOSPITAL_COMMUNITY): Payer: Self-pay

## 2022-07-04 ENCOUNTER — Other Ambulatory Visit (HOSPITAL_COMMUNITY): Payer: Self-pay

## 2022-07-05 ENCOUNTER — Other Ambulatory Visit (HOSPITAL_COMMUNITY): Payer: Self-pay

## 2022-07-06 ENCOUNTER — Other Ambulatory Visit (HOSPITAL_COMMUNITY): Payer: Self-pay

## 2022-07-07 ENCOUNTER — Other Ambulatory Visit: Payer: Self-pay

## 2022-07-07 ENCOUNTER — Other Ambulatory Visit (HOSPITAL_COMMUNITY): Payer: Self-pay

## 2022-07-08 ENCOUNTER — Other Ambulatory Visit: Payer: Self-pay

## 2022-07-08 ENCOUNTER — Other Ambulatory Visit (HOSPITAL_COMMUNITY): Payer: Self-pay

## 2022-07-09 ENCOUNTER — Other Ambulatory Visit (HOSPITAL_COMMUNITY): Payer: Self-pay

## 2022-07-11 ENCOUNTER — Other Ambulatory Visit (HOSPITAL_COMMUNITY): Payer: Self-pay

## 2022-07-11 ENCOUNTER — Other Ambulatory Visit: Payer: Self-pay

## 2022-07-12 ENCOUNTER — Other Ambulatory Visit (HOSPITAL_COMMUNITY): Payer: Self-pay

## 2022-07-12 ENCOUNTER — Telehealth: Payer: Self-pay

## 2022-07-12 ENCOUNTER — Other Ambulatory Visit: Payer: Self-pay

## 2022-07-12 MED ORDER — PEN NEEDLES 31G X 5 MM MISC
1 refills | Status: DC
  Filled 2022-07-12: qty 100, 90d supply, fill #0
  Filled 2022-10-06: qty 100, 90d supply, fill #1

## 2022-07-12 NOTE — Telephone Encounter (Signed)
Hey this pt is needing a prescription for pen needles to go with the forteo to be sent to [facility]. I would've sent them but I'm not sure which needles to send.

## 2022-07-13 ENCOUNTER — Other Ambulatory Visit (HOSPITAL_COMMUNITY): Payer: Self-pay

## 2022-07-14 ENCOUNTER — Other Ambulatory Visit (HOSPITAL_COMMUNITY): Payer: Self-pay

## 2022-07-26 ENCOUNTER — Encounter: Payer: Self-pay | Admitting: Family Medicine

## 2022-07-26 ENCOUNTER — Ambulatory Visit: Payer: Medicaid Other | Admitting: Family Medicine

## 2022-07-26 VITALS — BP 130/80 | HR 68 | Temp 97.9°F | Resp 16 | Wt 150.2 lb

## 2022-07-26 DIAGNOSIS — R030 Elevated blood-pressure reading, without diagnosis of hypertension: Secondary | ICD-10-CM

## 2022-07-26 NOTE — Progress Notes (Signed)
   Subjective:    Patient ID: Jeff Alexander, male    DOB: 1960/06/29, 62 y.o.   MRN: [id number]  HPI He is here for recheck on his blood pressure.  Blood pressure on his last visit was elevated.  He has no concerns or complaints. He is now on Medicaid.  Review of Systems     Objective:   Physical Exam  Alert and in no distress.  Blood pressure is recorded.      Assessment & Plan:  Elevated blood pressure reading Since his blood pressure now is in the normal range, no further intervention needed.

## 2022-07-28 ENCOUNTER — Other Ambulatory Visit (HOSPITAL_COMMUNITY): Payer: Self-pay

## 2022-08-01 ENCOUNTER — Other Ambulatory Visit (HOSPITAL_COMMUNITY): Payer: Self-pay

## 2022-08-01 MED ORDER — VENLAFAXINE HCL 50 MG PO TABS
50.0000 mg | ORAL_TABLET | Freq: Two times a day (BID) | ORAL | 11 refills | Status: DC
  Filled 2022-08-01: qty 60, 30d supply, fill #0

## 2022-08-02 ENCOUNTER — Other Ambulatory Visit (HOSPITAL_COMMUNITY): Payer: Self-pay

## 2022-08-03 ENCOUNTER — Other Ambulatory Visit (HOSPITAL_COMMUNITY): Payer: Self-pay

## 2022-08-05 ENCOUNTER — Other Ambulatory Visit (HOSPITAL_COMMUNITY): Payer: Self-pay

## 2022-08-05 ENCOUNTER — Other Ambulatory Visit: Payer: Self-pay

## 2022-08-08 ENCOUNTER — Other Ambulatory Visit: Payer: Self-pay

## 2022-08-23 DIAGNOSIS — Z8546 Personal history of malignant neoplasm of prostate: Secondary | ICD-10-CM | POA: Diagnosis not present

## 2022-08-23 DIAGNOSIS — E559 Vitamin D deficiency, unspecified: Secondary | ICD-10-CM | POA: Diagnosis not present

## 2022-08-24 ENCOUNTER — Other Ambulatory Visit (HOSPITAL_COMMUNITY): Payer: Self-pay

## 2022-08-29 ENCOUNTER — Other Ambulatory Visit (HOSPITAL_COMMUNITY): Payer: Self-pay

## 2022-08-29 DIAGNOSIS — C61 Malignant neoplasm of prostate: Secondary | ICD-10-CM | POA: Diagnosis not present

## 2022-08-29 MED ORDER — VENLAFAXINE HCL 50 MG PO TABS
50.0000 mg | ORAL_TABLET | Freq: Every day | ORAL | 3 refills | Status: DC
  Filled 2022-08-29: qty 90, 90d supply, fill #0
  Filled 2022-12-27: qty 90, 90d supply, fill #1
  Filled 2023-03-23: qty 90, 90d supply, fill #2
  Filled 2023-06-21: qty 90, 90d supply, fill #3

## 2022-08-30 ENCOUNTER — Other Ambulatory Visit (HOSPITAL_COMMUNITY): Payer: Self-pay

## 2022-09-23 ENCOUNTER — Other Ambulatory Visit: Payer: Self-pay

## 2022-09-26 ENCOUNTER — Other Ambulatory Visit: Payer: Self-pay

## 2022-09-26 ENCOUNTER — Other Ambulatory Visit (HOSPITAL_COMMUNITY): Payer: Self-pay

## 2022-09-27 ENCOUNTER — Other Ambulatory Visit (HOSPITAL_COMMUNITY): Payer: Self-pay

## 2022-09-28 ENCOUNTER — Other Ambulatory Visit (HOSPITAL_COMMUNITY): Payer: Self-pay

## 2022-10-06 ENCOUNTER — Other Ambulatory Visit (HOSPITAL_COMMUNITY): Payer: Self-pay

## 2022-10-13 ENCOUNTER — Other Ambulatory Visit (HOSPITAL_COMMUNITY): Payer: Self-pay

## 2022-10-18 ENCOUNTER — Other Ambulatory Visit (HOSPITAL_COMMUNITY): Payer: Self-pay

## 2022-10-21 ENCOUNTER — Other Ambulatory Visit (HOSPITAL_COMMUNITY): Payer: Self-pay

## 2022-10-21 ENCOUNTER — Other Ambulatory Visit: Payer: Self-pay

## 2022-10-24 ENCOUNTER — Other Ambulatory Visit: Payer: Self-pay

## 2022-11-16 ENCOUNTER — Other Ambulatory Visit (HOSPITAL_COMMUNITY): Payer: Self-pay

## 2022-11-21 ENCOUNTER — Other Ambulatory Visit (HOSPITAL_COMMUNITY): Payer: Self-pay

## 2022-11-24 ENCOUNTER — Other Ambulatory Visit (HOSPITAL_COMMUNITY): Payer: Self-pay

## 2022-11-24 DIAGNOSIS — E559 Vitamin D deficiency, unspecified: Secondary | ICD-10-CM | POA: Diagnosis not present

## 2022-11-24 DIAGNOSIS — R9721 Rising PSA following treatment for malignant neoplasm of prostate: Secondary | ICD-10-CM | POA: Diagnosis not present

## 2022-12-01 DIAGNOSIS — C61 Malignant neoplasm of prostate: Secondary | ICD-10-CM | POA: Diagnosis not present

## 2022-12-01 DIAGNOSIS — N393 Stress incontinence (female) (male): Secondary | ICD-10-CM | POA: Diagnosis not present

## 2022-12-13 ENCOUNTER — Other Ambulatory Visit (HOSPITAL_COMMUNITY): Payer: Self-pay

## 2022-12-13 ENCOUNTER — Other Ambulatory Visit: Payer: Self-pay | Admitting: Family Medicine

## 2022-12-13 ENCOUNTER — Other Ambulatory Visit: Payer: Self-pay

## 2022-12-13 MED ORDER — TERIPARATIDE 600 MCG/2.4ML ~~LOC~~ SOPN
PEN_INJECTOR | SUBCUTANEOUS | 5 refills | Status: DC
  Filled 2022-12-13: qty 2.4, 28d supply, fill #0
  Filled 2023-01-13: qty 2.4, 28d supply, fill #1
  Filled 2023-02-09: qty 2.4, 28d supply, fill #2
  Filled 2023-03-09: qty 2.4, 28d supply, fill #3
  Filled 2023-04-06: qty 2.4, 28d supply, fill #4
  Filled 2023-05-05: qty 2.4, 28d supply, fill #5

## 2022-12-13 NOTE — Telephone Encounter (Signed)
Last apt 07/26/22.

## 2022-12-14 ENCOUNTER — Other Ambulatory Visit (HOSPITAL_COMMUNITY): Payer: Self-pay

## 2022-12-14 ENCOUNTER — Other Ambulatory Visit: Payer: Self-pay

## 2022-12-17 ENCOUNTER — Encounter (HOSPITAL_COMMUNITY): Payer: Self-pay

## 2022-12-28 ENCOUNTER — Other Ambulatory Visit (HOSPITAL_COMMUNITY): Payer: Self-pay

## 2022-12-29 ENCOUNTER — Other Ambulatory Visit (HOSPITAL_COMMUNITY): Payer: Self-pay

## 2023-01-13 ENCOUNTER — Other Ambulatory Visit: Payer: Self-pay

## 2023-01-13 ENCOUNTER — Other Ambulatory Visit (HOSPITAL_COMMUNITY): Payer: Self-pay

## 2023-01-13 ENCOUNTER — Other Ambulatory Visit: Payer: Self-pay | Admitting: Family Medicine

## 2023-01-13 MED ORDER — PEN NEEDLES 31G X 5 MM MISC
1 refills | Status: DC
  Filled 2023-01-13: qty 100, 90d supply, fill #0
  Filled 2023-04-12 (×2): qty 100, 90d supply, fill #1

## 2023-01-13 NOTE — Progress Notes (Signed)
Specialty Pharmacy Refill Coordination Note  Jeff Alexander is a 62 y.o. male contacted today regarding refills of specialty medication(s) Teriparatide (Recombinant)   Patient requested [name] at [facility] at [place] date: 01/18/23   Medication will be filled on 01/17/23.

## 2023-01-16 ENCOUNTER — Other Ambulatory Visit: Payer: Self-pay

## 2023-01-26 DIAGNOSIS — H1045 Other chronic allergic conjunctivitis: Secondary | ICD-10-CM | POA: Diagnosis not present

## 2023-01-26 DIAGNOSIS — H43813 Vitreous degeneration, bilateral: Secondary | ICD-10-CM | POA: Diagnosis not present

## 2023-01-26 DIAGNOSIS — H0288B Meibomian gland dysfunction left eye, upper and lower eyelids: Secondary | ICD-10-CM | POA: Diagnosis not present

## 2023-01-26 DIAGNOSIS — H0288A Meibomian gland dysfunction right eye, upper and lower eyelids: Secondary | ICD-10-CM | POA: Diagnosis not present

## 2023-01-26 DIAGNOSIS — H2511 Age-related nuclear cataract, right eye: Secondary | ICD-10-CM | POA: Diagnosis not present

## 2023-01-26 DIAGNOSIS — H40053 Ocular hypertension, bilateral: Secondary | ICD-10-CM | POA: Diagnosis not present

## 2023-02-02 ENCOUNTER — Other Ambulatory Visit (HOSPITAL_COMMUNITY): Payer: Self-pay

## 2023-02-09 ENCOUNTER — Other Ambulatory Visit: Payer: Self-pay

## 2023-02-09 ENCOUNTER — Other Ambulatory Visit (HOSPITAL_COMMUNITY): Payer: Self-pay

## 2023-02-09 NOTE — Progress Notes (Signed)
Specialty Pharmacy Refill Coordination Note  Jeff Alexander is a 62 y.o. male contacted today regarding refills of specialty medication(s) Teriparatide   Patient requested [name] at [facility] at [place] date: 02/15/23   Medication will be filled on 02/14/23.

## 2023-02-14 ENCOUNTER — Other Ambulatory Visit: Payer: Self-pay

## 2023-02-17 DIAGNOSIS — H25811 Combined forms of age-related cataract, right eye: Secondary | ICD-10-CM | POA: Diagnosis not present

## 2023-02-20 DIAGNOSIS — R9721 Rising PSA following treatment for malignant neoplasm of prostate: Secondary | ICD-10-CM | POA: Diagnosis not present

## 2023-02-27 DIAGNOSIS — M818 Other osteoporosis without current pathological fracture: Secondary | ICD-10-CM | POA: Diagnosis not present

## 2023-02-27 DIAGNOSIS — C778 Secondary and unspecified malignant neoplasm of lymph nodes of multiple regions: Secondary | ICD-10-CM | POA: Diagnosis not present

## 2023-02-27 DIAGNOSIS — N393 Stress incontinence (female) (male): Secondary | ICD-10-CM | POA: Diagnosis not present

## 2023-02-27 DIAGNOSIS — C61 Malignant neoplasm of prostate: Secondary | ICD-10-CM | POA: Diagnosis not present

## 2023-03-03 DIAGNOSIS — H25812 Combined forms of age-related cataract, left eye: Secondary | ICD-10-CM | POA: Diagnosis not present

## 2023-03-09 ENCOUNTER — Other Ambulatory Visit: Payer: Self-pay

## 2023-03-09 ENCOUNTER — Other Ambulatory Visit (HOSPITAL_COMMUNITY): Payer: Self-pay

## 2023-03-09 NOTE — Progress Notes (Signed)
Specialty Pharmacy Refill Coordination Note  Jeff Alexander is a 62 y.o. male contacted today regarding refills of specialty medication(s) Teriparatide   Patient requested [name] at [facility] at [place] date: 03/14/23   Medication will be filled on 03/13/23.

## 2023-03-13 ENCOUNTER — Other Ambulatory Visit: Payer: Self-pay

## 2023-03-13 ENCOUNTER — Other Ambulatory Visit: Payer: Self-pay | Admitting: Urology

## 2023-03-13 DIAGNOSIS — M810 Age-related osteoporosis without current pathological fracture: Secondary | ICD-10-CM

## 2023-03-14 ENCOUNTER — Other Ambulatory Visit (HOSPITAL_COMMUNITY): Payer: Self-pay

## 2023-03-23 ENCOUNTER — Other Ambulatory Visit (HOSPITAL_COMMUNITY): Payer: Self-pay

## 2023-03-23 ENCOUNTER — Other Ambulatory Visit: Payer: Self-pay

## 2023-03-24 ENCOUNTER — Other Ambulatory Visit (HOSPITAL_COMMUNITY): Payer: Self-pay

## 2023-04-06 ENCOUNTER — Other Ambulatory Visit: Payer: Self-pay

## 2023-04-06 NOTE — Progress Notes (Signed)
 Specialty Pharmacy Refill Coordination Note  Jeff Alexander is a 63 y.o. male contacted today regarding refills of specialty medication(s) Teriparatide    Patient requested [name] at [facility] at [place] date: 04/11/23   Medication will be filled on 04/10/23.

## 2023-04-12 ENCOUNTER — Other Ambulatory Visit: Payer: Self-pay

## 2023-04-12 ENCOUNTER — Other Ambulatory Visit (HOSPITAL_COMMUNITY): Payer: Self-pay

## 2023-04-17 ENCOUNTER — Other Ambulatory Visit (HOSPITAL_COMMUNITY): Payer: Self-pay

## 2023-05-05 ENCOUNTER — Other Ambulatory Visit (HOSPITAL_COMMUNITY): Payer: Self-pay

## 2023-05-05 ENCOUNTER — Other Ambulatory Visit: Payer: Self-pay

## 2023-05-05 ENCOUNTER — Encounter (HOSPITAL_COMMUNITY): Payer: Self-pay

## 2023-05-05 NOTE — Progress Notes (Signed)
 Specialty Pharmacy Ongoing Clinical Assessment Note  Jeff Alexander is a 63 y.o. male who is being followed by the specialty pharmacy service for RxSp Osteoporosis   Patient's specialty medication(s) reviewed today: Teriparatide    Missed doses in the last 4 weeks: 0   Patient/Caregiver did not have any additional questions or concerns.   Therapeutic benefit summary: Patient is achieving benefit   Adverse events/side effects summary: No adverse events/side effects   Patient's therapy is appropriate to: Continue    Goals Addressed             This Visit's Progress    Prevent or reduce bone loss       Patient is on track. Patient will maintain adherence         Follow up:  6 months  Melville Engen M Emon Miggins Specialty Pharmacist

## 2023-05-05 NOTE — Progress Notes (Signed)
 Specialty Pharmacy Refill Coordination Note  Jeff Alexander is a 63 y.o. male contacted today regarding refills of specialty medication(s) Teriparatide    Patient requested [name] at [facility] at [place] date: 05/12/23   Medication will be filled on 05/11/23.

## 2023-05-10 ENCOUNTER — Encounter (HOSPITAL_COMMUNITY): Payer: Self-pay

## 2023-05-11 ENCOUNTER — Other Ambulatory Visit: Payer: Self-pay

## 2023-05-11 ENCOUNTER — Other Ambulatory Visit (HOSPITAL_COMMUNITY): Payer: Self-pay

## 2023-05-22 DIAGNOSIS — R9721 Rising PSA following treatment for malignant neoplasm of prostate: Secondary | ICD-10-CM | POA: Diagnosis not present

## 2023-05-23 ENCOUNTER — Encounter: Payer: Self-pay | Admitting: Internal Medicine

## 2023-05-29 DIAGNOSIS — C61 Malignant neoplasm of prostate: Secondary | ICD-10-CM | POA: Diagnosis not present

## 2023-05-29 DIAGNOSIS — C778 Secondary and unspecified malignant neoplasm of lymph nodes of multiple regions: Secondary | ICD-10-CM | POA: Diagnosis not present

## 2023-05-29 DIAGNOSIS — N393 Stress incontinence (female) (male): Secondary | ICD-10-CM | POA: Diagnosis not present

## 2023-05-29 DIAGNOSIS — M818 Other osteoporosis without current pathological fracture: Secondary | ICD-10-CM | POA: Diagnosis not present

## 2023-05-29 DIAGNOSIS — N5231 Erectile dysfunction following radical prostatectomy: Secondary | ICD-10-CM | POA: Diagnosis not present

## 2023-06-01 ENCOUNTER — Other Ambulatory Visit (HOSPITAL_COMMUNITY): Payer: Self-pay

## 2023-06-01 ENCOUNTER — Other Ambulatory Visit: Payer: Self-pay

## 2023-06-01 ENCOUNTER — Other Ambulatory Visit: Payer: Self-pay | Admitting: Family Medicine

## 2023-06-01 MED ORDER — TERIPARATIDE 600 MCG/2.4ML ~~LOC~~ SOPN
PEN_INJECTOR | SUBCUTANEOUS | 5 refills | Status: DC
  Filled 2023-06-01: qty 2.4, 28d supply, fill #0
  Filled 2023-06-29: qty 2.4, 28d supply, fill #1

## 2023-06-01 NOTE — Telephone Encounter (Signed)
 Last apt 07/26/22 has apt coming up 06/19/23.

## 2023-06-01 NOTE — Progress Notes (Signed)
 Specialty Pharmacy Refill Coordination Note  Jeff Alexander is a 63 y.o. male contacted today regarding refills of specialty medication(s) Teriparatide   Patient requested [name] at [facility] at [place] date: 06/07/23   Medication will be filled on 06/06/23, pending refill approval.   This fill date is pending response to refill request from provider. Patient is aware and if they have not received fill by intended date they must follow up with pharmacy.

## 2023-06-02 ENCOUNTER — Other Ambulatory Visit: Payer: Self-pay

## 2023-06-02 ENCOUNTER — Other Ambulatory Visit (HOSPITAL_COMMUNITY): Payer: Self-pay

## 2023-06-22 ENCOUNTER — Encounter: Payer: Self-pay | Admitting: Family Medicine

## 2023-06-22 ENCOUNTER — Ambulatory Visit (INDEPENDENT_AMBULATORY_CARE_PROVIDER_SITE_OTHER): Payer: BC Managed Care – PPO | Admitting: Family Medicine

## 2023-06-22 VITALS — BP 132/84 | HR 74 | Ht 66.0 in | Wt 155.8 lb

## 2023-06-22 DIAGNOSIS — J301 Allergic rhinitis due to pollen: Secondary | ICD-10-CM

## 2023-06-22 DIAGNOSIS — C61 Malignant neoplasm of prostate: Secondary | ICD-10-CM | POA: Diagnosis not present

## 2023-06-22 DIAGNOSIS — Z1211 Encounter for screening for malignant neoplasm of colon: Secondary | ICD-10-CM

## 2023-06-22 DIAGNOSIS — M818 Other osteoporosis without current pathological fracture: Secondary | ICD-10-CM

## 2023-06-22 DIAGNOSIS — Z8619 Personal history of other infectious and parasitic diseases: Secondary | ICD-10-CM

## 2023-06-22 DIAGNOSIS — T387X5A Adverse effect of androgens and anabolic congeners, initial encounter: Secondary | ICD-10-CM | POA: Diagnosis not present

## 2023-06-22 DIAGNOSIS — E781 Pure hyperglyceridemia: Secondary | ICD-10-CM

## 2023-06-22 DIAGNOSIS — R7303 Prediabetes: Secondary | ICD-10-CM

## 2023-06-22 DIAGNOSIS — N393 Stress incontinence (female) (male): Secondary | ICD-10-CM | POA: Diagnosis not present

## 2023-06-22 DIAGNOSIS — Z0000 Encounter for general adult medical examination without abnormal findings: Secondary | ICD-10-CM

## 2023-06-22 DIAGNOSIS — Z23 Encounter for immunization: Secondary | ICD-10-CM | POA: Diagnosis not present

## 2023-06-22 LAB — COMPREHENSIVE METABOLIC PANEL WITH GFR
ALT: 11 IU/L (ref 0–44)
AST: 18 IU/L (ref 0–40)
Albumin: 4.2 g/dL (ref 3.9–4.9)
Alkaline Phosphatase: 133 IU/L — ABNORMAL HIGH (ref 44–121)
BUN/Creatinine Ratio: 14 (ref 10–24)
BUN: 13 mg/dL (ref 8–27)
Bilirubin Total: 0.3 mg/dL (ref 0.0–1.2)
CO2: 22 mmol/L (ref 20–29)
Calcium: 9.3 mg/dL (ref 8.6–10.2)
Chloride: 102 mmol/L (ref 96–106)
Creatinine, Ser: 0.92 mg/dL (ref 0.76–1.27)
Globulin, Total: 2.2 g/dL (ref 1.5–4.5)
Glucose: 101 mg/dL — ABNORMAL HIGH (ref 70–99)
Potassium: 4.5 mmol/L (ref 3.5–5.2)
Sodium: 138 mmol/L (ref 134–144)
Total Protein: 6.4 g/dL (ref 6.0–8.5)
eGFR: 94 mL/min/{1.73_m2} (ref >59)

## 2023-06-22 LAB — POCT GLYCOSYLATED HEMOGLOBIN (HGB A1C): Hemoglobin A1C: 5.6 % (ref 4.0–5.6)

## 2023-06-22 LAB — CBC WITH DIFFERENTIAL/PLATELET
Basophils Absolute: 0 10*3/uL (ref 0.0–0.2)
Basos: 1 %
EOS (ABSOLUTE): 0.1 10*3/uL (ref 0.0–0.4)
Eos: 2 %
Hematocrit: 40.6 % (ref 37.5–51.0)
Hemoglobin: 13.8 g/dL (ref 13.0–17.7)
Immature Grans (Abs): 0 10*3/uL (ref 0.0–0.1)
Immature Granulocytes: 0 %
Lymphocytes Absolute: 1.4 10*3/uL (ref 0.7–3.1)
Lymphs: 22 %
MCH: 30.7 pg (ref 26.6–33.0)
MCHC: 34 g/dL (ref 31.5–35.7)
MCV: 90 fL (ref 79–97)
Monocytes Absolute: 0.4 10*3/uL (ref 0.1–0.9)
Monocytes: 6 %
Neutrophils Absolute: 4.6 10*3/uL (ref 1.4–7.0)
Neutrophils: 69 %
Platelets: 278 10*3/uL (ref 150–450)
RBC: 4.5 x10E6/uL (ref 4.14–5.80)
RDW: 13.2 % (ref 11.6–15.4)
WBC: 6.6 10*3/uL (ref 3.4–10.8)

## 2023-06-22 LAB — LIPID PANEL
Chol/HDL Ratio: 4.4 ratio (ref 0.0–5.0)
Cholesterol, Total: 231 mg/dL — ABNORMAL HIGH (ref 100–199)
HDL: 53 mg/dL (ref >39)
LDL Chol Calc (NIH): 146 mg/dL — ABNORMAL HIGH (ref 0–99)
Triglycerides: 176 mg/dL — ABNORMAL HIGH (ref 0–149)
VLDL Cholesterol Cal: 32 mg/dL (ref 5–40)

## 2023-06-22 NOTE — Progress Notes (Signed)
 Complete physical exam  Patient: Jeff Alexander   DOB: 01/24/1961   63 y.o. Male  MRN: [id number]  Subjective:    Chief Complaint  Patient presents with   Annual Exam    CPE fasting labs, no other issues,     Jeff Alexander is a 63 y.o. male who presents today for a complete physical exam. He reports consuming a  low carb and low cholesterol  diet. Walks every day 4-5 times a week Exercise is limited by none. He generally feels fairly well. He reports sleeping fairly well. He continues to be followed by urology.  At this point things seem to be status quo.  He does need the Effexor because of the hot flashes and the present dosing seems to be adequate.  He still having some difficulty with incontinence but he has had under control.  Continues on Forteo.  He apparently is going to get another DEXA scan up on a yearly basis.  Discussed every 2 years but will defer to urology.  He does have a previous slightly elevated blood sugar and we will follow-up on that.  His allergies seem to be under good control.  Most recent fall risk assessment:    06/22/2023    9:26 AM  Fall Risk   Falls in the past year? 0  Number falls in past yr: 0  Injury with Fall? 0  Risk for fall due to : No Fall Risks  Follow up Falls evaluation completed     Most recent depression screenings:    06/22/2023    9:27 AM 06/20/2022    9:23 AM  PHQ 2/9 Scores  PHQ - 2 Score 0 0        Patient Care Team: Ronnald Nian, MD as PCP - General (Family Medicine)   Outpatient Medications Prior to Visit  Medication Sig   ALPRAZolam (XANAX) 0.25 MG tablet TAKE (1) TABLET TWICE DAILY AS NEEDED FOR ANXIETY.   Calcium Acetate, Phos Binder, (CALCIUM ACETATE PO) Take by mouth.   Enzalutamide (XTANDI PO) Take by mouth.   Injection Device MISC by Does not apply route. Eliguard injection   Insulin Pen Needle (PEN NEEDLES) 31G X 5 MM MISC Use as instructed. For Forteo injection   Leuprolide Acetate, 3 Month, (ELIGARD)  22.5 MG injection Inject 22.5 mg into the skin every 3 (three) months.   loratadine (CLARITIN) 10 MG tablet Take 10 mg by mouth daily as needed for allergies.   Teriparatide (FORTEO) 600 MCG/2.4ML SOPN Inject 20 mcg subcutaneously once daily.   Teriparatide, Recombinant, (FORTEO) 600 MCG/2.4ML SOPN Inject [place] daily   venlafaxine (EFFEXOR) 50 MG tablet Take 1 tablet (50 mg total) by mouth at bedtime.   VITAMIN D PO Take by mouth.   [DISCONTINUED] venlafaxine (EFFEXOR) 50 MG tablet Take 1 tablet (50 mg total) by mouth 2 (two) times daily. (Patient not taking: Reported on 06/22/2023)   [DISCONTINUED] venlafaxine XR (EFFEXOR-XR) 75 MG 24 hr capsule TAKE 1 CAPSULE IN THE MORNING WITH BREAKFAST. (Patient not taking: Reported on 06/22/2023)   No facility-administered medications prior to visit.    Review of Systems  All other systems reviewed and are negative. Family and social history as well as health maintenance and immunizations was reviewed.        Objective:     BP 132/84   Pulse 74   Ht 5\' 6"  (1.676 m)   Wt 155 lb 12.8 oz (70.7 kg)   BMI 25.15 kg/m  Physical Exam   Alert and in no distress. Tympanic membranes and canals are normal. Pharyngeal area is normal. Neck is supple without adenopathy or thyromegaly. Cardiac exam shows a regular sinus rhythm without murmurs or gallops. Lungs are clear to auscultation.   The 10-year ASCVD risk score (Arnett DK, et al., 2019) is: 11.5%   Values used to calculate the score:     Age: 43 years     Sex: Male     Is Non-Hispanic African American: No     Diabetic: No     Tobacco smoker: No     Systolic Blood Pressure: 132 mmHg     Is BP treated: No     HDL Cholesterol: 53 mg/dL     Total Cholesterol: 231 mg/dL  Assessment & Plan:    Routine general medical examination at a health care facility  Non-seasonal allergic rhinitis due to pollen - Plan: CBC with Differential/Platelet, Comprehensive metabolic panel  History of  shingles  History of herpes labialis  Osteoporosis due to androgen therapy  Pure hypertriglyceridemia - Plan: Lipid panel  Prostate cancer (HCC)  Stress incontinence, male  Prediabetes - Plan: POCT glycosylated hemoglobin (Hb A1C)  Screening for colon cancer - Plan: Cologuard  Need for vaccination against Streptococcus pneumoniae - Plan: Pneumococcal conjugate vaccine 20-valent (Prevnar 20)  Immunization History  Administered Date(s) Administered   Influenza,inj,Quad PF,6+ Mos 02/16/2018, 12/06/2018, 12/28/2020, 01/17/2022   Influenza-Unspecified 02/16/2018   PFIZER(Purple Top)SARS-COV-2 Vaccination 06/01/2019, 07/04/2019, 02/06/2020   Pfizer Covid-19 Vaccine Bivalent Booster 36yrs & up 01/11/2021   Pfizer(Comirnaty)Fall Seasonal Vaccine 12 years and older 01/17/2022   Tdap 09/14/2009, 07/11/2017   Zoster Recombinant(Shingrix) 10/12/2020, 12/14/2020    Health Maintenance  Topic Date Due   INFLUENZA VACCINE  10/27/2022   COVID-19 Vaccine (6 - 2024-25 season) 11/27/2022   Pneumococcal Vaccine 26-4 Years old (1 of 2 - PCV) 10/17/2025 (Originally 10/19/1966)   Fecal DNA (Cologuard)  07/17/2023   DTaP/Tdap/Td (3 - Td or Tdap) 07/12/2027   Hepatitis C Screening  Completed   HIV Screening  Completed   Zoster Vaccines- Shingrix  Completed   HPV VACCINES  Aged Out   Colonoscopy  Discontinued      Problem List Items Addressed This Visit     Allergic rhinitis   Relevant Orders   CBC with Differential/Platelet   Comprehensive metabolic panel   History of herpes labialis   History of shingles   Hyperlipidemia   Relevant Orders   Lipid panel   Osteoporosis due to androgen therapy   Prediabetes   Relevant Orders   POCT glycosylated hemoglobin (Hb A1C) (Completed)   Prostate cancer (HCC)   Stress incontinence, male   Other Visit Diagnoses       Routine general medical examination at a health care facility    -  Primary     Screening for colon cancer       Relevant  Orders   Cologuard     Need for vaccination against Streptococcus pneumoniae       Relevant Orders   Pneumococcal conjugate vaccine 20-valent (Prevnar 20)     At this point he seems to be stable on his present regimen. Return in about 1 year (around 06/21/2024).     Sharlot Gowda, MD

## 2023-06-23 ENCOUNTER — Encounter: Payer: Self-pay | Admitting: Family Medicine

## 2023-06-28 ENCOUNTER — Other Ambulatory Visit (HOSPITAL_COMMUNITY): Payer: Self-pay

## 2023-06-29 ENCOUNTER — Other Ambulatory Visit: Payer: Self-pay

## 2023-06-29 ENCOUNTER — Other Ambulatory Visit: Payer: Self-pay | Admitting: Pharmacy Technician

## 2023-06-29 NOTE — Progress Notes (Signed)
 Specialty Pharmacy Refill Coordination Note  Jeff Alexander is a 63 y.o. male contacted today regarding refills of specialty medication(s) Teriparatide   Patient requested (Patient-Rptd) Pickup at [facility] at [facility] date: (Patient-Rptd) 07/06/23   Medication will be filled on 07/04/23.

## 2023-07-04 ENCOUNTER — Other Ambulatory Visit: Payer: Self-pay

## 2023-07-04 MED ORDER — TERIPARATIDE 560 MCG/2.24ML ~~LOC~~ SOPN
PEN_INJECTOR | SUBCUTANEOUS | 4 refills | Status: DC
  Filled 2023-07-04: qty 2.24, 28d supply, fill #0
  Filled 2023-07-31: qty 2.24, 28d supply, fill #1
  Filled 2023-08-29 (×2): qty 2.24, 28d supply, fill #2
  Filled 2023-09-22: qty 2.24, 28d supply, fill #3
  Filled 2023-10-23: qty 2.24, 28d supply, fill #4

## 2023-07-05 ENCOUNTER — Other Ambulatory Visit: Payer: Self-pay

## 2023-07-25 ENCOUNTER — Other Ambulatory Visit: Payer: Self-pay

## 2023-07-31 ENCOUNTER — Other Ambulatory Visit (HOSPITAL_COMMUNITY): Payer: Self-pay

## 2023-07-31 ENCOUNTER — Other Ambulatory Visit: Payer: Self-pay

## 2023-07-31 ENCOUNTER — Other Ambulatory Visit: Payer: Self-pay | Admitting: Pharmacy Technician

## 2023-07-31 NOTE — Progress Notes (Signed)
 Specialty Pharmacy Refill Coordination Note  ATAVION REDSTONE is a 63 y.o. male contacted today regarding refills of specialty medication(s) Teriparatide    Patient requested Cranston Dk at [facility] at [place] date: 08/02/23   Medication will be filled on 08/02/23.

## 2023-08-02 ENCOUNTER — Other Ambulatory Visit: Payer: Self-pay

## 2023-08-25 ENCOUNTER — Other Ambulatory Visit (HOSPITAL_COMMUNITY): Payer: Self-pay

## 2023-08-25 ENCOUNTER — Other Ambulatory Visit: Payer: Self-pay

## 2023-08-28 DIAGNOSIS — R9721 Rising PSA following treatment for malignant neoplasm of prostate: Secondary | ICD-10-CM | POA: Diagnosis not present

## 2023-08-29 ENCOUNTER — Other Ambulatory Visit: Payer: Self-pay

## 2023-08-29 ENCOUNTER — Telehealth: Payer: Self-pay | Admitting: *Deleted

## 2023-08-29 ENCOUNTER — Other Ambulatory Visit (HOSPITAL_COMMUNITY): Payer: Self-pay

## 2023-08-29 ENCOUNTER — Other Ambulatory Visit: Payer: Self-pay | Admitting: *Deleted

## 2023-08-29 ENCOUNTER — Other Ambulatory Visit: Payer: Self-pay | Admitting: Family Medicine

## 2023-08-29 MED ORDER — PEN NEEDLES 31G X 5 MM MISC
1 refills | Status: DC
  Filled 2023-08-29: qty 100, 90d supply, fill #0

## 2023-08-29 MED ORDER — PEN NEEDLES 31G X 5 MM MISC
1 refills | Status: DC
  Filled 2023-08-30 (×2): qty 100, 90d supply, fill #0
  Filled 2023-11-23: qty 100, 90d supply, fill #1

## 2023-08-29 NOTE — Progress Notes (Signed)
 Specialty Pharmacy Refill Coordination Note  Jeff Alexander is a 63 y.o. male contacted today regarding refills of specialty medication(s) Teriparatide    Patient requested Cranston Dk at [facility] at [place] date: 08/30/23   Medication will be filled on 06.03.25.

## 2023-08-29 NOTE — Telephone Encounter (Signed)
 Spoke with patient and he only needs the pen needles.

## 2023-08-29 NOTE — Telephone Encounter (Signed)
 Copied from CRM [id number]. Topic: Clinical - Prescription Issue >> Aug 29, 2023  9:24 AM Oddis Bench wrote: Reason for CRM: Patient is calling to inform that the pharmacy is sending over refill for  Teriparatide  (FORTEO ) 600 MCG/2.4ML SOPN Teriparatide  560 MCG/2.24ML SOPN Teriparatide , Recombinant, (FORTEO ) 600 MCG/2.4ML SOPN He is out of the the injectible pens   Needs refill.

## 2023-08-30 ENCOUNTER — Other Ambulatory Visit (HOSPITAL_COMMUNITY): Payer: Self-pay

## 2023-09-04 DIAGNOSIS — N5231 Erectile dysfunction following radical prostatectomy: Secondary | ICD-10-CM | POA: Diagnosis not present

## 2023-09-04 DIAGNOSIS — C61 Malignant neoplasm of prostate: Secondary | ICD-10-CM | POA: Diagnosis not present

## 2023-09-04 DIAGNOSIS — N393 Stress incontinence (female) (male): Secondary | ICD-10-CM | POA: Diagnosis not present

## 2023-09-16 LAB — COLOGUARD: COLOGUARD: NEGATIVE

## 2023-09-17 ENCOUNTER — Other Ambulatory Visit (HOSPITAL_COMMUNITY): Payer: Self-pay

## 2023-09-18 ENCOUNTER — Other Ambulatory Visit (HOSPITAL_COMMUNITY): Payer: Self-pay

## 2023-09-18 MED ORDER — VENLAFAXINE HCL 50 MG PO TABS
50.0000 mg | ORAL_TABLET | Freq: Every day | ORAL | 3 refills | Status: AC
  Filled 2023-09-18: qty 90, 90d supply, fill #0
  Filled 2023-12-21: qty 90, 90d supply, fill #1
  Filled 2024-03-18: qty 90, 90d supply, fill #2

## 2023-09-22 ENCOUNTER — Encounter (INDEPENDENT_AMBULATORY_CARE_PROVIDER_SITE_OTHER): Payer: Self-pay

## 2023-09-22 ENCOUNTER — Other Ambulatory Visit (HOSPITAL_COMMUNITY): Payer: Self-pay

## 2023-09-22 ENCOUNTER — Other Ambulatory Visit: Payer: Self-pay

## 2023-09-22 NOTE — Progress Notes (Signed)
 Specialty Pharmacy Refill Coordination Note  MyChart Questionnaire Submission    Jeff Alexander is a 63 y.o. male contacted today regarding refills of specialty medication(s) Teriparatide .  Injection date: 10/03/23.   Patient requested: (Patient-Rptd) Pickup at [facility] at [facility] date: (Patient-Rptd) 09/28/23  Medication will be filled on 09/27/23.

## 2023-10-17 DIAGNOSIS — Z961 Presence of intraocular lens: Secondary | ICD-10-CM | POA: Diagnosis not present

## 2023-10-17 DIAGNOSIS — H40053 Ocular hypertension, bilateral: Secondary | ICD-10-CM | POA: Diagnosis not present

## 2023-10-19 ENCOUNTER — Other Ambulatory Visit: Payer: Self-pay

## 2023-10-23 ENCOUNTER — Other Ambulatory Visit (HOSPITAL_COMMUNITY): Payer: Self-pay

## 2023-10-23 ENCOUNTER — Other Ambulatory Visit: Payer: Self-pay

## 2023-10-23 NOTE — Progress Notes (Signed)
 Specialty Pharmacy Refill Coordination Note  Spoke with Jeff Alexander is a 63 y.o. male contacted today regarding refills of specialty medication(s) Teriparatide   Doses on hand: 10   Injection date: 11/03/23  Patient requested: [name] at [facility] at [place] date: 10/25/23  Medication will be filled on 10/24/23.

## 2023-10-24 ENCOUNTER — Other Ambulatory Visit: Payer: Self-pay

## 2023-11-08 ENCOUNTER — Other Ambulatory Visit: Payer: BC Managed Care – PPO

## 2023-11-14 ENCOUNTER — Ambulatory Visit (HOSPITAL_BASED_OUTPATIENT_CLINIC_OR_DEPARTMENT_OTHER)
Admission: RE | Admit: 2023-11-14 | Discharge: 2023-11-14 | Disposition: A | Discharge status: Home / Self Care | Source: Ambulatory Visit | Attending: Urology | Admitting: Urology

## 2023-11-14 DIAGNOSIS — M810 Age-related osteoporosis without current pathological fracture: Secondary | ICD-10-CM | POA: Insufficient documentation

## 2023-11-17 ENCOUNTER — Encounter (INDEPENDENT_AMBULATORY_CARE_PROVIDER_SITE_OTHER): Payer: Self-pay

## 2023-11-17 ENCOUNTER — Other Ambulatory Visit: Payer: Self-pay

## 2023-11-17 ENCOUNTER — Other Ambulatory Visit (HOSPITAL_COMMUNITY): Payer: Self-pay

## 2023-11-17 ENCOUNTER — Other Ambulatory Visit: Payer: Self-pay | Admitting: Family Medicine

## 2023-11-17 MED ORDER — TERIPARATIDE 560 MCG/2.24ML ~~LOC~~ SOPN
PEN_INJECTOR | SUBCUTANEOUS | 4 refills | Status: DC
  Filled 2023-11-17: qty 2.24, 28d supply, fill #0
  Filled 2023-12-21: qty 2.24, 28d supply, fill #1
  Filled 2024-01-17 – 2024-01-19 (×2): qty 2.24, 28d supply, fill #2
  Filled 2024-02-12: qty 2.24, 28d supply, fill #3
  Filled 2024-03-08: qty 2.24, 28d supply, fill #4

## 2023-11-17 NOTE — Progress Notes (Signed)
 Specialty Pharmacy Refill Coordination Note  Jeff Alexander is a 63 y.o. male contacted today regarding refills of specialty medication(s) Teriparatide    Patient requested (Patient-Rptd) Pickup at [facility] at [facility] date: (Patient-Rptd) 11/24/23   Medication will be filled on 08.28.25.

## 2023-11-17 NOTE — Telephone Encounter (Signed)
 Is this okay to refill?

## 2023-11-18 ENCOUNTER — Other Ambulatory Visit (HOSPITAL_COMMUNITY): Payer: Self-pay

## 2023-11-18 ENCOUNTER — Other Ambulatory Visit: Payer: Self-pay

## 2023-11-23 ENCOUNTER — Other Ambulatory Visit: Payer: Self-pay

## 2023-11-23 ENCOUNTER — Other Ambulatory Visit (HOSPITAL_COMMUNITY): Payer: Self-pay

## 2023-12-11 DIAGNOSIS — C61 Malignant neoplasm of prostate: Secondary | ICD-10-CM | POA: Diagnosis not present

## 2023-12-11 DIAGNOSIS — M818 Other osteoporosis without current pathological fracture: Secondary | ICD-10-CM | POA: Diagnosis not present

## 2023-12-11 DIAGNOSIS — N393 Stress incontinence (female) (male): Secondary | ICD-10-CM | POA: Diagnosis not present

## 2023-12-11 DIAGNOSIS — N5231 Erectile dysfunction following radical prostatectomy: Secondary | ICD-10-CM | POA: Diagnosis not present

## 2023-12-11 DIAGNOSIS — C778 Secondary and unspecified malignant neoplasm of lymph nodes of multiple regions: Secondary | ICD-10-CM | POA: Diagnosis not present

## 2023-12-14 ENCOUNTER — Other Ambulatory Visit: Payer: Self-pay | Admitting: Pharmacy Technician

## 2023-12-14 ENCOUNTER — Other Ambulatory Visit: Payer: Self-pay

## 2023-12-14 ENCOUNTER — Encounter (INDEPENDENT_AMBULATORY_CARE_PROVIDER_SITE_OTHER): Payer: Self-pay

## 2023-12-14 NOTE — Progress Notes (Signed)
 Specialty Pharmacy Refill Coordination Note  Jeff Alexander is a 63 y.o. male contacted today regarding refills of specialty medication(s) Teriparatide    Patient requested [name] at [facility] at [place] date: 12/21/23   Medication will be filled on 12/21/23. Answered questionnaire.

## 2023-12-21 ENCOUNTER — Other Ambulatory Visit: Payer: Self-pay

## 2024-01-17 ENCOUNTER — Encounter (HOSPITAL_COMMUNITY): Payer: Self-pay | Admitting: Emergency Medicine

## 2024-01-17 ENCOUNTER — Other Ambulatory Visit: Payer: Self-pay

## 2024-01-17 ENCOUNTER — Emergency Department (HOSPITAL_COMMUNITY)
Admission: EM | Admit: 2024-01-17 | Discharge: 2024-01-17 | Disposition: A | Discharge status: Home / Self Care | Attending: Emergency Medicine | Admitting: Emergency Medicine

## 2024-01-17 ENCOUNTER — Ambulatory Visit: Payer: Self-pay

## 2024-01-17 ENCOUNTER — Other Ambulatory Visit (HOSPITAL_COMMUNITY): Payer: Self-pay

## 2024-01-17 ENCOUNTER — Emergency Department (HOSPITAL_COMMUNITY)

## 2024-01-17 DIAGNOSIS — R112 Nausea with vomiting, unspecified: Secondary | ICD-10-CM | POA: Insufficient documentation

## 2024-01-17 DIAGNOSIS — N289 Disorder of kidney and ureter, unspecified: Secondary | ICD-10-CM | POA: Diagnosis not present

## 2024-01-17 DIAGNOSIS — N2889 Other specified disorders of kidney and ureter: Secondary | ICD-10-CM | POA: Insufficient documentation

## 2024-01-17 DIAGNOSIS — Z8546 Personal history of malignant neoplasm of prostate: Secondary | ICD-10-CM | POA: Diagnosis not present

## 2024-01-17 DIAGNOSIS — R109 Unspecified abdominal pain: Secondary | ICD-10-CM | POA: Diagnosis not present

## 2024-01-17 DIAGNOSIS — Z794 Long term (current) use of insulin: Secondary | ICD-10-CM | POA: Diagnosis not present

## 2024-01-17 DIAGNOSIS — E860 Dehydration: Secondary | ICD-10-CM | POA: Diagnosis not present

## 2024-01-17 LAB — URINALYSIS, ROUTINE W REFLEX MICROSCOPIC
Bacteria, UA: NONE SEEN
Bilirubin Urine: NEGATIVE
Glucose, UA: NEGATIVE mg/dL
Ketones, ur: 5 mg/dL — AB
Leukocytes,Ua: NEGATIVE
Nitrite: NEGATIVE
Protein, ur: 30 mg/dL — AB
Specific Gravity, Urine: 1.013 (ref 1.005–1.030)
pH: 5 (ref 5.0–8.0)

## 2024-01-17 LAB — CBC WITH DIFFERENTIAL/PLATELET
Abs Immature Granulocytes: 0.03 K/uL (ref 0.00–0.07)
Basophils Absolute: 0 K/uL (ref 0.0–0.1)
Basophils Relative: 0 %
Eosinophils Absolute: 0 K/uL (ref 0.0–0.5)
Eosinophils Relative: 0 %
HCT: 41.8 % (ref 39.0–52.0)
Hemoglobin: 14.4 g/dL (ref 13.0–17.0)
Immature Granulocytes: 0 %
Lymphocytes Relative: 4 %
Lymphs Abs: 0.5 K/uL — ABNORMAL LOW (ref 0.7–4.0)
MCH: 31 pg (ref 26.0–34.0)
MCHC: 34.4 g/dL (ref 30.0–36.0)
MCV: 89.9 fL (ref 80.0–100.0)
Monocytes Absolute: 0.2 K/uL (ref 0.1–1.0)
Monocytes Relative: 2 %
Neutro Abs: 10.3 K/uL — ABNORMAL HIGH (ref 1.7–7.7)
Neutrophils Relative %: 94 %
Platelets: 315 K/uL (ref 150–400)
RBC: 4.65 MIL/uL (ref 4.22–5.81)
RDW: 13.2 % (ref 11.5–15.5)
WBC: 11 K/uL — ABNORMAL HIGH (ref 4.0–10.5)
nRBC: 0 % (ref 0.0–0.2)

## 2024-01-17 LAB — COMPREHENSIVE METABOLIC PANEL WITH GFR
ALT: 9 U/L (ref 0–44)
AST: 20 U/L (ref 15–41)
Albumin: 4.6 g/dL (ref 3.5–5.0)
Alkaline Phosphatase: 127 U/L — ABNORMAL HIGH (ref 38–126)
Anion gap: 14 (ref 5–15)
BUN: 17 mg/dL (ref 8–23)
CO2: 24 mmol/L (ref 22–32)
Calcium: 10.4 mg/dL — ABNORMAL HIGH (ref 8.9–10.3)
Chloride: 102 mmol/L (ref 98–111)
Creatinine, Ser: 0.88 mg/dL (ref 0.61–1.24)
GFR, Estimated: >60 mL/min (ref >60)
Glucose, Bld: 191 mg/dL — ABNORMAL HIGH (ref 70–99)
Potassium: 4 mmol/L (ref 3.5–5.1)
Sodium: 140 mmol/L (ref 135–145)
Total Bilirubin: 0.4 mg/dL (ref 0.0–1.2)
Total Protein: 7.9 g/dL (ref 6.5–8.1)

## 2024-01-17 LAB — LIPASE, BLOOD: Lipase: 13 U/L (ref 11–51)

## 2024-01-17 MED ORDER — ONDANSETRON 8 MG PO TBDP: 8.0000 mg | ORAL_TABLET | Freq: Three times a day (TID) | ORAL | 0 refills | Status: AC | PRN

## 2024-01-17 MED ORDER — LACTATED RINGERS IV BOLUS
1000.0000 mL | Freq: Once | INTRAVENOUS | Status: AC
  Administered 2024-01-17: 1000 mL via INTRAVENOUS

## 2024-01-17 MED ORDER — ONDANSETRON 8 MG PO TBDP
8.0000 mg | ORAL_TABLET | Freq: Once | ORAL | Status: AC
  Administered 2024-01-17: 8 mg via ORAL
  Filled 2024-01-17: qty 1

## 2024-01-17 MED ORDER — IOHEXOL 300 MG/ML  SOLN
100.0000 mL | Freq: Once | INTRAMUSCULAR | Status: AC | PRN
  Administered 2024-01-17: 100 mL via INTRAVENOUS

## 2024-01-17 MED ORDER — ONDANSETRON HCL 4 MG/2ML IJ SOLN
4.0000 mg | Freq: Once | INTRAMUSCULAR | Status: AC | PRN
  Administered 2024-01-17: 4 mg via INTRAVENOUS
  Filled 2024-01-17: qty 2

## 2024-01-17 NOTE — ED Provider Triage Note (Signed)
 Emergency Medicine Provider Triage Evaluation Note  Jeff Alexander , a 63 y.o. male  was evaluated in triage.  Pt complains of nausea, vomiting, diarrhea.  Reports some lower abdominal pain as well.  This all started this morning around 0300.  He cannot exactly recall what he ate yesterday reports has been with the cheese sandwich and some hot chocolate.  He is currently doing oral chemo for metastatic prostate cancer.  Has already had a prostatectomy.  He sees Dr. Cam.  Denies any fevers.  Denies any black or bloody vomit or stool  Review of Systems  Positive:  Negative:   Physical Exam  BP (!) 180/95 (BP Location: Left Arm)   Pulse 80   Temp 97.9 F (36.6 C) (Oral)   Resp 16   Ht 5' 6 (1.676 m)   Wt 70 kg   SpO2 100%   BMI 24.91 kg/m  Gen:   Awake, uncomfortable  Resp:  Normal effort  MSK:   Moves extremities without difficulty  Other:  Abdomen soft and non tender.   Medical Decision Making  Medically screening exam initiated at 2:34 PM.  Appropriate orders placed.  LANCER THURNER was informed that the remainder of the evaluation will be completed by another provider, this initial triage assessment does not replace that evaluation, and the importance of remaining in the ED until their evaluation is complete.  CT and labs ordered   Bernis Ernst, [place] 01/17/24 1435

## 2024-01-17 NOTE — Discharge Instructions (Signed)
 You were seen in the emergency room for nausea, vomiting and dehydration. You had CT scan completed in the emergency room that revealed incidental finding of right-sided kidney mass.  We recommend that you follow-up with urologist for further evaluation of this renal mass.  Take Zofran  for nausea. Please return to the ER if your symptoms worsen; you have increased pain, fevers, chills, inability to keep any medications down, confusion. Otherwise see the outpatient doctor as requested.

## 2024-01-17 NOTE — Telephone Encounter (Signed)
 FYI Only or Action Required?: FYI only for provider.  Patient was last seen in primary care on 06/22/2023 by Joyce Norleen BROCKS, MD.  Called Nurse Triage reporting Fatigue.  Symptoms began today.  Interventions attempted: Rest, hydration, or home remedies.  Symptoms are: unchanged.  Triage Disposition: Go to ED Now (or PCP Triage)  Patient/caregiver understands and will follow disposition?: Yes  **referred to Ed for symptoms**         Copied from CRM #[id number]. Topic: Clinical - Red Word Triage >> Jan 17, 2024 12:48 PM Charlet HERO wrote: Red Word that prompted transfer to Nurse Triage: Patient is stating that he is feeling weak and nauseated since 3Am this morning he is not able to stand feels like he is going to fall is aching in his stomach a little diahrreah. Reason for Disposition  Patient sounds very sick or weak to the triager  Answer Assessment - Initial Assessment Questions 1. DESCRIPTION: Describe how you are feeling.     Weak with standing   2. SEVERITY: How bad is it?  Can you stand and walk?     Yes, he can walk- but must sit down shortly after as he stated he will fall  3. ONSET: When did these symptoms begin? (e.g., hours, days, weeks, months)     Since 3 am this morning  4. CAUSE: What do you think is causing the weakness or fatigue? (e.g., not drinking enough fluids, medical problem, trouble sleeping)     Unsure   5. NEW MEDICINES:  Have you started on any new medicines recently? (e.g., opioid pain medicines, benzodiazepines, muscle relaxants, antidepressants, antihistamines, neuroleptics, beta blockers)     No   6. OTHER SYMPTOMS: Do you have any other symptoms? (e.g., chest pain, fever, cough, SOB, vomiting, diarrhea, bleeding, other areas of pain)  Nauseated, vomiting x 3 times, no liquid, dry heaving , abdominal pain- moderate, mild diarrhea, reports feeling hot like a hot flash. Referred to Ed for symptoms; patient agrees with plan of  care.  Protocols used: Weakness (Generalized) and Fatigue-A-AH

## 2024-01-17 NOTE — ED Notes (Signed)
 Pt reports feeling better. Thinks he can go home

## 2024-01-17 NOTE — ED Triage Notes (Signed)
 Pt reports waking up today with n/v/d. Endorses abd soreness. Pt on tx for prostate cancer.

## 2024-01-17 NOTE — ED Provider Notes (Addendum)
 [facility] EMERGENCY DEPARTMENT AT [facility] Provider Note   CSN: [id number] Arrival date & time: 01/17/24  1334     Patient presents with: Emesis   Jeff Alexander is a 63 y.o. male.   HPI    62 year old male comes in with chief complaint of nausea, vomiting.  Patient has previous history of prostate cancer.  Patient reports that he woke up this morning to urinate.  However, he started dry heaving.  Subsequently has had 4 episodes of emesis with brown vomitus.  Patient's last emesis was 2 PM.  Patient has had normal bowel movements.   Patient reports having some left-sided abdominal discomfort.  He denies any burning with urination, blood in the urine.  Patient denies any fevers, chills.  However, he was feeling sweaty when the symptoms first started.  Prior to Admission medications   Medication Sig Start Date End Date Taking? Authorizing Provider  ondansetron  (ZOFRAN -ODT) 8 MG disintegrating tablet Take 1 tablet (8 mg total) by mouth every 8 (eight) hours as needed for nausea. 01/17/24  Yes Ramadan Couey, MD  ALPRAZolam  (XANAX ) 0.25 MG tablet TAKE (1) TABLET TWICE DAILY AS NEEDED FOR ANXIETY. 10/16/20   Lalonde, John C, MD  Calcium Acetate, Phos Binder, (CALCIUM ACETATE PO) Take by mouth.    [provider]  Enzalutamide (XTANDI PO) Take by mouth.    [provider]  Injection Device MISC by Does not apply route. Eliguard injection    [provider]  Insulin  Pen Needle (PEN NEEDLES) 31G X 5 MM MISC Use as instructed. For Forteo  injection 08/29/23   Joyce Norleen BROCKS, MD  Leuprolide  Acetate, 3 Month, (ELIGARD ) 22.5 MG injection Inject 22.5 mg into the skin every 3 (three) months.    [provider]  loratadine (CLARITIN) 10 MG tablet Take 10 mg by mouth daily as needed for allergies.    [provider]  Teriparatide  560 MCG/2.24ML SOPN Inject 20 mcg subcutaneously once daily. 11/17/23   Joyce Norleen BROCKS, MD  venlafaxine   (EFFEXOR ) 50 MG tablet Take 1 tablet (50 mg total) by mouth at bedtime. 09/18/23     VITAMIN D PO Take by mouth.    [provider]    Allergies: Codeine    Review of Systems  All other systems reviewed and are negative.   Updated Vital Signs BP (!) 170/86   Pulse 77   Temp 99.5 F (37.5 C) (Oral)   Resp 17   Ht 5' 6 (1.676 m)   Wt 70 kg   SpO2 96%   BMI 24.91 kg/m   Physical Exam Vitals and nursing note reviewed.  Constitutional:      Appearance: He is well-developed.  HENT:     Head: Atraumatic.  Eyes:     Extraocular Movements: Extraocular movements intact.     Pupils: Pupils are equal, round, and reactive to light.  Cardiovascular:     Rate and Rhythm: Normal rate.  Pulmonary:     Effort: Pulmonary effort is normal.  Musculoskeletal:     Cervical back: Neck supple.  Skin:    General: Skin is warm.  Neurological:     Mental Status: He is alert and oriented to person, place, and time.     (all labs ordered are listed, but only abnormal results are displayed) Labs Reviewed  CBC WITH DIFFERENTIAL/PLATELET - Abnormal; Notable for the following components:      Result Value   WBC 11.0 (*)    Neutro Abs 10.3 (*)  Lymphs Abs 0.5 (*)    All other components within normal limits  COMPREHENSIVE METABOLIC PANEL WITH GFR - Abnormal; Notable for the following components:   Glucose, Bld 191 (*)    Calcium 10.4 (*)    Alkaline Phosphatase 127 (*)    All other components within normal limits  URINALYSIS, ROUTINE W REFLEX MICROSCOPIC - Abnormal; Notable for the following components:   Hgb urine dipstick MODERATE (*)    Ketones, ur 5 (*)    Protein, ur 30 (*)    All other components within normal limits  LIPASE, BLOOD    EKG: None  Radiology: CT ABDOMEN PELVIS W CONTRAST Result Date: 01/17/2024 EXAM: CT ABDOMEN AND PELVIS WITH CONTRAST 01/17/2024 05:03:40 PM TECHNIQUE: CT of the abdomen and pelvis was performed with the administration of 100 mL of  iohexol  (OMNIPAQUE ) 300 MG/ML solution. Multiplanar reformatted images are provided for review. Automated exposure control, iterative reconstruction, and/or weight-based adjustment of the mA/kV was utilized to reduce the radiation dose to as low as reasonably achievable. COMPARISON: None available. CLINICAL HISTORY: Abdominal pain, acute, nonlocalized. Per triage note: Pt reports waking up today with n/v/d. Endorses abd soreness. Pt on tx for prostate cancer. FINDINGS: LOWER CHEST: No acute abnormality. LIVER: Hepatic steatosis. GALLBLADDER AND BILE DUCTS: Cholelithiasis. No evidence of acute cholecystitis. No biliary ductal dilatation. SPLEEN: No acute abnormality. PANCREAS: No acute abnormality. ADRENAL GLANDS: 1.8 cm left adrenal nodule with enhancement (Hounsfield units 91). This has increased from CT 04/05/2018. KIDNEYS, URETERS AND BLADDER: There is a new heterogeneously enhancing mass in the right kidney measuring 5.2 x 4.7 cm. This is highly concerning for renal cell carcinoma. Nondistended bladder. No stones in the kidneys or ureters. No hydronephrosis. No perinephric or periureteral stranding. GI AND BOWEL: Stomach demonstrates no acute abnormality. There is no bowel obstruction. PERITONEUM AND RETROPERITONEUM: No ascites. No free air. VASCULATURE: Aortic atherosclerotic calcification. Aorta is normal in caliber. LYMPH NODES: No lymphadenopathy. REPRODUCTIVE ORGANS: Prostatectomy. BONES AND SOFT TISSUES: No acute osseous abnormality. No focal soft tissue abnormality. IMPRESSION: 1. New heterogeneously enhancing right renal mass measuring 5.2, highly concerning for renal cell carcinoma; recommend urology consultation. 2. Enlarging 1.8 cm left adrenal nodule with enhancement (91 HU), increased from 04/05/2018; indeterminate recommend biochemical evaluation and further evaluation with non-emergent adrenal washout CT or chemical-shift MR. 3. Hepatic steatosis. 4. Colonic diverticulosis. 5. Cholelithiasis.  Electronically signed by: Norman Gatlin MD 01/17/2024 05:17 PM EDT RP Workstation: [id number]     Procedures   Medications Ordered in the ED  iohexol  (OMNIPAQUE ) 300 MG/ML solution 100 mL (100 mLs Intravenous Contrast Given 01/17/24 1652)  ondansetron  (ZOFRAN ) injection 4 mg (4 mg Intravenous Given 01/17/24 1749)  lactated ringers  bolus 1,000 mL (0 mLs Intravenous Stopped 01/17/24 2130)  ondansetron  (ZOFRAN -ODT) disintegrating tablet 8 mg (8 mg Oral Given 01/17/24 1905)                                    Medical Decision Making Risk Prescription drug management.   63 year old male comes in with chief complaint of nausea, vomiting and some abdominal discomfort.  He has previous history of prostate cancer.  Exam is overall reassuring.  No peritonitis.  Patient had labs and CT ordered by provider in triage.  CT scan reveals incidental finding of right sided kidney mass, which could be concerning for malignancy.  This finding has been discussed with the patient.  Patient follows up with Dr.  Cam, urology, who is to see for prostate cancer.  Have advised that patient follow-up with urology again for the renal mass.  Patient had CT scan that revealed no GI findings.  Labs are otherwise reassuring.  No evidence of severe electrolyte abnormality, UA is reassuring.   P.o. challenge initiated and patient has passed.  He feels dehydrated, therefore IV fluids ordered.  Urine has ketones.  9:36 PM Pt reassessed. Pt's VSS and WNL. Pt's cap refill < 3 seconds. Pt has been hydrated in the ER and now passed po challenge. We will discharge with antiemetic. Strict ER return precautions have been discussed and pt will return if he is unable to tolerate fluids and symptoms are getting worse.   Final diagnoses:  Dehydration  Renal mass, right    ED Discharge Orders          Ordered    ondansetron  (ZOFRAN -ODT) 8 MG disintegrating tablet  Every 8 hours PRN        01/17/24 2129                Charlyn Sora, MD 01/17/24 2135    Charlyn Sora, MD 01/17/24 2136

## 2024-01-19 ENCOUNTER — Other Ambulatory Visit: Payer: Self-pay

## 2024-01-19 ENCOUNTER — Other Ambulatory Visit (HOSPITAL_COMMUNITY): Payer: Self-pay

## 2024-01-19 NOTE — Progress Notes (Signed)
 Clinical Intervention Note  Clinical Intervention Notes: Patient reports initiating ondansetron . No DDIs identified with teriparatide .   Clinical Intervention Outcomes: Prevention of an adverse drug event   Jeff Alexander Specialty Pharmacist

## 2024-01-19 NOTE — Progress Notes (Signed)
 Specialty Pharmacy Ongoing Clinical Assessment Note  Jeff Alexander is a 63 y.o. male who is being followed by the specialty pharmacy service for RxSp Osteoporosis   Patient's specialty medication(s) reviewed today: Teriparatide    Missed doses in the last 4 weeks: 0   Patient/Caregiver did not have any additional questions or concerns.   Therapeutic benefit summary: Patient is achieving benefit   Adverse events/side effects summary: No adverse events/side effects   Patient's therapy is appropriate to: Continue    Goals Addressed             This Visit's Progress    Prevent or reduce bone loss   On track    Patient is on track. Patient will maintain adherence         Follow up: 12 months  Chevelle Coulson M Fredrik Mogel Specialty Pharmacist

## 2024-01-19 NOTE — Progress Notes (Signed)
 Specialty Pharmacy Refill Coordination Note  Jeff Alexander is a 63 y.o. male contacted today regarding refills of specialty medication(s) Teriparatide    Patient requested [name] at [facility] at [place] date: 01/22/24   Medication will be filled on 01/22/24.

## 2024-01-22 ENCOUNTER — Other Ambulatory Visit: Payer: Self-pay

## 2024-01-22 ENCOUNTER — Other Ambulatory Visit (HOSPITAL_COMMUNITY): Payer: Self-pay

## 2024-01-23 ENCOUNTER — Ambulatory Visit: Admitting: Family Medicine

## 2024-01-23 ENCOUNTER — Encounter: Payer: Self-pay | Admitting: Family Medicine

## 2024-01-23 VITALS — BP 136/74 | HR 76 | Ht 67.0 in | Wt 150.0 lb

## 2024-01-23 DIAGNOSIS — N2889 Other specified disorders of kidney and ureter: Secondary | ICD-10-CM | POA: Diagnosis not present

## 2024-01-23 DIAGNOSIS — Z23 Encounter for immunization: Secondary | ICD-10-CM

## 2024-01-23 NOTE — Progress Notes (Signed)
 Name: Jeff Alexander   Date of Visit: 01/23/24   Date of last visit with me: Visit date not found   CHIEF COMPLAINT:  Chief Complaint  Patient presents with   other    Last Wednesday am got up, felt nauseous, hot flash, started sweating, dry heaves, eventually vomited a few times-went to [facility] was dehydrated, High B/P, showed mass over kidney. Going to [facility] this Friday.       HPI:  Discussed the use of AI scribe software for clinical note transcription with the patient, who gave verbal consent to proceed.  History of Present Illness   Jeff Alexander is a 63 year old male with metastatic cancer who presents with nausea and weakness.  He has been experiencing significant nausea and weakness, which made him feel uncomfortable standing and prompted him to seek medical attention. He received an injection for nausea in the emergency room and was prescribed medication, taking one pill later that evening and another either that night or the following morning. He has not taken any more since then, as his symptoms have largely resolved.  He is maintaining hydration by increasing water intake and consuming Gatorade to ensure adequate electrolyte balance. He has not consumed coffee since his symptoms began.  An incidental finding of a kidney mass was noted. No visible blood in urine, tenderness, or pain. He has a history of metastatic cancer and is currently taking medications to prevent recurrence.  No known history of cancer in immediate family, although his mother's sister died of breast cancer. He denies any history of smoking cigarettes.         OBJECTIVE:       06/22/2023    9:27 AM  Depression screen PHQ 2/9  Decreased Interest 0  Down, Depressed, Hopeless 0  PHQ - 2 Score 0     BP Readings from Last 3 Encounters:  01/23/24 136/74  01/17/24 (!) 174/97  06/22/23 132/84    BP 136/74   Pulse 76   Ht 5' 7 (1.702 m)   Wt 150 lb (68 kg)   SpO2 98%   BMI 23.49  kg/m    Physical Exam   VITALS: BP- 130/70      Physical Exam Constitutional:      Appearance: Normal appearance.  Neurological:     General: No focal deficit present.     Mental Status: He is alert and oriented to person, place, and time. Mental status is at baseline.     ASSESSMENT/PLAN:   Assessment & Plan Need for influenza vaccination  Mass of kidney    Assessment and Plan    Suspected renal mass (possible renal cell carcinoma) Incidental renal mass concerning for renal cell carcinoma. Previous metastatic cancer, but this is a new mass. Differential includes renal cell carcinoma. Early detection crucial. - Attend urology appointment with Dr. Morene Buddy on Friday. - Defer to urologist's recommendations for further imaging or intervention. - Consider nephrectomy if renal cell carcinoma is confirmed.  Hypertension Blood pressure slightly elevated at 130s/70s. No medication adjustments to avoid potential hypotension affecting kidney function. Caffeine intake may contribute to elevation. - ED visit reviewed.  - Monitor blood pressure at home twice daily for 10 days. - Use an upper arm blood pressure monitor, not a wrist monitor. - Review blood pressure readings at follow-up appointment in two weeks.  Immunization counseling and planning Recently received flu shot. Plans for COVID-19 vaccination in two weeks to avoid simultaneous administration and potential  discomfort. - Schedule COVID-19 vaccination in two weeks.         Dakotah Orrego A. Vita MD [facility]

## 2024-01-25 ENCOUNTER — Other Ambulatory Visit: Payer: Self-pay | Admitting: Family Medicine

## 2024-01-25 ENCOUNTER — Other Ambulatory Visit (HOSPITAL_COMMUNITY): Payer: Self-pay

## 2024-01-25 DIAGNOSIS — C61 Malignant neoplasm of prostate: Secondary | ICD-10-CM

## 2024-01-25 NOTE — Telephone Encounter (Signed)
 Copied from CRM [id number]. Topic: Clinical - Medication Refill >> Jan 25, 2024  8:21 AM Amy B wrote: Medication: ALPRAZolam  (XANAX ) 0.25 MG tablet  Has the patient contacted their pharmacy? Yes (Agent: If no, request that the patient contact the pharmacy for the refill. If patient does not wish to contact the pharmacy document the reason why and proceed with request.) (Agent: If yes, when and what did the pharmacy advise?)  This is the patient's preferred pharmacy:  [place] [facility] [address] [place] [address] Phone: [phone] Fax: [phone]  Is this the correct pharmacy for this prescription? Yes If no, delete pharmacy and type the correct one.   Has the prescription been filled recently? No  Is the patient out of the medication? Yes  Has the patient been seen for an appointment in the last year OR does the patient have an upcoming appointment? Yes  Can we respond through MyChart? Yes  Agent: Please be advised that Rx refills may take up to 3 business days. We ask that you follow-up with your pharmacy.

## 2024-01-26 DIAGNOSIS — D49511 Neoplasm of unspecified behavior of right kidney: Secondary | ICD-10-CM | POA: Diagnosis not present

## 2024-02-02 ENCOUNTER — Other Ambulatory Visit: Payer: Self-pay | Admitting: Urology

## 2024-02-06 ENCOUNTER — Ambulatory Visit: Payer: Self-pay | Admitting: Family Medicine

## 2024-02-06 ENCOUNTER — Encounter: Payer: Self-pay | Admitting: Family Medicine

## 2024-02-06 ENCOUNTER — Other Ambulatory Visit (HOSPITAL_COMMUNITY): Payer: Self-pay

## 2024-02-06 VITALS — BP 136/82 | HR 72 | Wt 152.4 lb

## 2024-02-06 DIAGNOSIS — R7303 Prediabetes: Secondary | ICD-10-CM

## 2024-02-06 DIAGNOSIS — I10 Essential (primary) hypertension: Secondary | ICD-10-CM | POA: Diagnosis not present

## 2024-02-06 DIAGNOSIS — Z23 Encounter for immunization: Secondary | ICD-10-CM | POA: Diagnosis not present

## 2024-02-06 LAB — POCT GLYCOSYLATED HEMOGLOBIN (HGB A1C): Hemoglobin A1C: 6 % — AB (ref 4.0–5.6)

## 2024-02-06 MED ORDER — LISINOPRIL 20 MG PO TABS
20.0000 mg | ORAL_TABLET | Freq: Every day | ORAL | 3 refills | Status: AC
  Filled 2024-02-06: qty 90, 90d supply, fill #0
  Filled 2024-05-01: qty 90, 90d supply, fill #1

## 2024-02-06 NOTE — Progress Notes (Signed)
   Name: Jeff Alexander   Date of Visit: 02/06/24   Date of last visit with me: 01/23/2024   CHIEF COMPLAINT:  Chief Complaint  Patient presents with   Follow-up    2 week follow up on blood pressure. Has surgery for cancer scheduled.        HPI:  Discussed the use of AI scribe software for clinical note transcription with the patient, who gave verbal consent to proceed.  History of Present Illness   Jeff Alexander is a 63 year old male with hypertension who presents for blood pressure management and pre-surgical evaluation.  He is scheduled for surgery on December 19th. His blood pressure has been fluctuating at home, with readings ranging from the 140s to 160s. He has not been on ACE inhibitors previously but is familiar with lisinopril as his partner takes it. Blood pressures still elevated at home, no headaches or vision changes.   He has a history of kidney issues, including cancer. He is prediabetic.         OBJECTIVE:       06/22/2023    9:27 AM  Depression screen PHQ 2/9  Decreased Interest 0  Down, Depressed, Hopeless 0  PHQ - 2 Score 0     BP Readings from Last 3 Encounters:  02/06/24 136/82  01/23/24 136/74  01/17/24 (!) 174/97    BP 136/82   Pulse 72   Wt 152 lb 6.4 oz (69.1 kg)   SpO2 98%   BMI 23.87 kg/m    Physical Exam   VITALS: BP- 136/82      Physical Exam  ASSESSMENT/PLAN:   Assessment & Plan Need for COVID-19 vaccine  Primary hypertension  Prediabetes    Assessment and Plan    Essential hypertension Blood pressure variable at home, current 136/82. Lisinopril chosen for kidney protection and blood pressure control pre-surgery. - Initiated lisinopril 20 mg daily. - Monitor blood pressure at home. - Contact provider if blood pressure remains high. - Hold lisinopril on surgery day, resume post-procedure.  Prediabetes A1c will be checked to monitor glucose levels. - Ordered A1c test.          Jeff Alexander A. Vita MD [facility]

## 2024-02-12 ENCOUNTER — Other Ambulatory Visit (HOSPITAL_COMMUNITY): Payer: Self-pay

## 2024-02-13 ENCOUNTER — Telehealth: Payer: Self-pay | Admitting: Family Medicine

## 2024-02-13 NOTE — Telephone Encounter (Signed)
 Surgical clearance form from [facility] received and sent back in folder

## 2024-02-14 ENCOUNTER — Other Ambulatory Visit: Payer: Self-pay

## 2024-02-14 ENCOUNTER — Other Ambulatory Visit (HOSPITAL_COMMUNITY): Payer: Self-pay

## 2024-02-14 NOTE — Progress Notes (Signed)
 Specialty Pharmacy Refill Coordination Note  Jeff Alexander is a 63 y.o. male contacted today regarding refills of specialty medication(s) Teriparatide    Patient requested [name] at [facility] at [place] date: 02/16/24   Medication will be filled on: 02/15/24

## 2024-02-15 NOTE — Telephone Encounter (Signed)
 Clearance form faxed back to Urology

## 2024-02-17 ENCOUNTER — Other Ambulatory Visit (HOSPITAL_COMMUNITY): Payer: Self-pay

## 2024-02-20 ENCOUNTER — Other Ambulatory Visit: Payer: Self-pay

## 2024-03-04 NOTE — Progress Notes (Signed)
 Anesthesia Review:  PCP: Cardiologist :  PPM/ ICD: Device Orders: Rep Notified:  Chest x-ray : EKG : Echo : Stress test: Cardiac Cath :   Activity level:  Sleep Study/ CPAP : Fasting Blood Sugar :      / Checks Blood Sugar -- times a day:    Blood Thinner/ Instructions /Last Dose: ASA / Instructions/ Last Dose :

## 2024-03-04 NOTE — Patient Instructions (Signed)
 SURGICAL WAITING ROOM VISITATION  Patients having surgery or a procedure may have no more than 2 support people in the waiting area - these visitors may rotate.    Children under the age of 69 must have an adult with them who is not the patient.  Visitors with respiratory illnesses are discouraged from visiting and should remain at home.  If the patient needs to stay at the hospital during part of their recovery, the visitor guidelines for inpatient rooms apply. Pre-op nurse will coordinate an appropriate time for 1 support person to accompany patient in pre-op.  This support person may not rotate.    Please refer to the [facility] website for the visitor guidelines for Inpatients (after your surgery is over and you are in a regular room).       Your procedure is scheduled on:  03/15/2024    Report to [facility] Main Entrance    Report to admitting at  0515 AM   Call this number if you have problems the morning of surgery [phone]   Do not eat food  or drink liquids :After Midnight.            Clear liquid diet the day before surgery.  Miralax 238g with 64 ounces of Gatorade ( no red) day before surgery.  Drink 8 ounce glass every 15-30 minutes until completed.     After Midnight you may have the following liquids until _ _____ AM/ PM DAY OF SURGERY  Water Non-Citrus Juices (without pulp, NO RED-Apple, White grape, White cranberry) Black Coffee (NO MILK/CREAM OR CREAMERS, sugar ok)  Clear Tea (NO MILK/CREAM OR CREAMERS, sugar ok) regular and decaf                             Plain Jell-O (NO RED)                                           Fruit ices (not with fruit pulp, NO RED)                                     Popsicles (NO RED)                                                               Sports drinks like Gatorade (NO RED)                           If you have questions, please contact your surgeon's office.   FOLLOW BOWEL PREP AND ANY ADDITIONAL PRE OP  INSTRUCTIONS YOU RECEIVED FROM YOUR SURGEON'S OFFICE!!!     Oral Hygiene is also important to reduce your risk of infection.                                    Remember - BRUSH YOUR TEETH THE MORNING OF SURGERY WITH YOUR REGULAR TOOTHPASTE  DENTURES WILL BE REMOVED PRIOR TO SURGERY  PLEASE DO NOT APPLY Poly grip OR ADHESIVES!!!   Do NOT smoke after Midnight   Stop all vitamins and herbal supplements 7 days before surgery.   Take these medicines the morning of surgery with A SIP OF WATER:   xanax  if needed, claritin   DO NOT TAKE ANY ORAL DIABETIC MEDICATIONS DAY OF YOUR SURGERY  Bring CPAP mask and tubing day of surgery.                              You may not have any metal on your body including hair pins, jewelry, and body piercing             Do not wear make-up, lotions, powders, perfumes/cologne, or deodorant  Do not wear nail polish including gel and S&S, artificial/acrylic nails, or any other type of covering on natural nails including finger and toenails. If you have artificial nails, gel coating, etc. that needs to be removed by a nail salon please have this removed prior to surgery or surgery may need to be canceled/ delayed if the surgeon/ anesthesia feels like they are unable to be safely monitored.   Do not shave  48 hours prior to surgery.               Men may shave face and neck.   Do not bring valuables to the hospital. [facility] IS NOT             RESPONSIBLE   FOR VALUABLES.   Contacts, glasses, dentures or bridgework may not be worn into surgery.   Bring small overnight bag day of surgery.   DO NOT BRING YOUR HOME MEDICATIONS TO THE HOSPITAL. PHARMACY WILL DISPENSE MEDICATIONS LISTED ON YOUR MEDICATION LIST TO YOU DURING YOUR ADMISSION IN THE HOSPITAL!    Patients discharged on the day of surgery will not be allowed to drive home.  Someone NEEDS to stay with you for the first 24 hours after anesthesia.   Special Instructions: Bring a copy of your  healthcare power of attorney and living will documents the day of surgery if you haven't scanned them before.              Please read over the following fact sheets you were given: IF YOU HAVE QUESTIONS ABOUT YOUR PRE-OP INSTRUCTIONS PLEASE CALL [phone].   If you received a COVID test during your pre-op visit  it is requested that you wear a mask when out in public, stay away from anyone that may not be feeling well and notify your surgeon if you develop symptoms. If you test positive for Covid or have been in contact with anyone that has tested positive in the last 10 days please notify you surgeon.    [facility] - Preparing for Surgery Before surgery, you can play an important role.  Because skin is not sterile, your skin needs to be as free of germs as possible.  You can reduce the number of germs on your skin by washing with CHG (chlorahexidine gluconate) soap before surgery.  CHG is an antiseptic cleaner which kills germs and bonds with the skin to continue killing germs even after washing. Please DO NOT use if you have an allergy to CHG or antibacterial soaps.  If your skin becomes reddened/irritated stop using the CHG and inform your nurse when you arrive at Short Stay. Do not shave (including legs and underarms) for at least 48 hours prior  to the first CHG shower.  You may shave your face/neck.  Please follow these instructions carefully:  1.  Shower with CHG Soap the night before surgery ONLY (DO NOT USE THE SOAP THE MORNING OF SURGERY).  2.  If you choose to wash your hair, wash your hair first as usual with your normal  shampoo.  3.  After you shampoo, rinse your hair and body thoroughly to remove the shampoo.                             4.  Use CHG as you would any other liquid soap.  You can apply chg directly to the skin and wash.  Gently with a scrungie or clean washcloth.  5.  Apply the CHG Soap to your body ONLY FROM THE NECK DOWN.   Do   not use on face/ open                            Wound or open sores. Avoid contact with eyes, ears mouth and   genitals (private parts).                       Wash face,  Genitals (private parts) with your normal soap.             6.  Wash thoroughly, paying special attention to the area where your    surgery  will be performed.  7.  Thoroughly rinse your body with warm water from the neck down.  8.  DO NOT shower/wash with your normal soap after using and rinsing off the CHG Soap.                9.  Pat yourself dry with a clean towel.            10.  Wear clean pajamas.            11.  Place clean sheets on your bed the night of your first shower and do not  sleep with pets. Day of Surgery : Do not apply any CHG, lotions/deodorants the morning of surgery.  Please wear clean clothes to the hospital/surgery center.  FAILURE TO FOLLOW THESE INSTRUCTIONS MAY RESULT IN THE CANCELLATION OF YOUR SURGERY  PATIENT SIGNATURE_________________________________  NURSE SIGNATURE__________________________________  ________________________________________________________________________

## 2024-03-05 ENCOUNTER — Encounter (HOSPITAL_COMMUNITY): Payer: Self-pay

## 2024-03-05 ENCOUNTER — Encounter (HOSPITAL_COMMUNITY)
Admission: RE | Admit: 2024-03-05 | Discharge: 2024-03-05 | Disposition: A | Source: Ambulatory Visit | Attending: Urology

## 2024-03-05 ENCOUNTER — Other Ambulatory Visit: Payer: Self-pay

## 2024-03-05 VITALS — BP 143/85 | HR 81 | Temp 99.0°F | Resp 16 | Ht 66.5 in | Wt 152.1 lb

## 2024-03-05 DIAGNOSIS — Z01818 Encounter for other preprocedural examination: Secondary | ICD-10-CM

## 2024-03-05 HISTORY — DX: Unspecified osteoarthritis, unspecified site: M19.90

## 2024-03-05 HISTORY — DX: Essential (primary) hypertension: I10

## 2024-03-05 LAB — CBC
HCT: 37.5 % — ABNORMAL LOW (ref 39.0–52.0)
Hemoglobin: 12.8 g/dL — ABNORMAL LOW (ref 13.0–17.0)
MCH: 31 pg (ref 26.0–34.0)
MCHC: 34.1 g/dL (ref 30.0–36.0)
MCV: 90.8 fL (ref 80.0–100.0)
Platelets: 327 K/uL (ref 150–400)
RBC: 4.13 MIL/uL — ABNORMAL LOW (ref 4.22–5.81)
RDW: 12.5 % (ref 11.5–15.5)
WBC: 12 K/uL — ABNORMAL HIGH (ref 4.0–10.5)
nRBC: 0 % (ref 0.0–0.2)

## 2024-03-05 LAB — BASIC METABOLIC PANEL WITH GFR
Anion gap: 10 (ref 5–15)
BUN: 13 mg/dL (ref 8–23)
CO2: 26 mmol/L (ref 22–32)
Calcium: 10.3 mg/dL (ref 8.9–10.3)
Chloride: 103 mmol/L (ref 98–111)
Creatinine, Ser: 0.89 mg/dL (ref 0.61–1.24)
GFR, Estimated: >60 mL/min (ref >60)
Glucose, Bld: 117 mg/dL — ABNORMAL HIGH (ref 70–99)
Potassium: 4.3 mmol/L (ref 3.5–5.1)
Sodium: 138 mmol/L (ref 135–145)

## 2024-03-08 ENCOUNTER — Other Ambulatory Visit: Payer: Self-pay

## 2024-03-08 ENCOUNTER — Other Ambulatory Visit (HOSPITAL_COMMUNITY): Payer: Self-pay

## 2024-03-08 ENCOUNTER — Encounter: Payer: Self-pay | Admitting: Family Medicine

## 2024-03-08 ENCOUNTER — Ambulatory Visit: Admitting: Family Medicine

## 2024-03-08 VITALS — BP 128/84 | HR 81 | Wt 148.2 lb

## 2024-03-08 DIAGNOSIS — J069 Acute upper respiratory infection, unspecified: Secondary | ICD-10-CM | POA: Diagnosis not present

## 2024-03-08 MED ORDER — FLUTICASONE PROPIONATE 50 MCG/ACT NA SUSP
2.0000 | Freq: Every day | NASAL | 6 refills | Status: AC
  Filled 2024-03-08: qty 16, 30d supply, fill #0
  Filled 2024-04-02: qty 16, 30d supply, fill #1

## 2024-03-08 NOTE — Progress Notes (Signed)
° °  Name: Jeff Alexander   Date of Visit: 03/08/2024   Date of last visit with me: 02/13/2024   CHIEF COMPLAINT:  Chief Complaint  Patient presents with   Acute Visit    Cold symptoms, congestion and runny nose, cough because of congestion. Symptoms have gone away besides runny noses. Has been taking dayquil and nyquil. Has surgery a week from today on 12/19. Has been clear for surgery was told to follow up with pcp.        HPI:  Discussed the use of AI scribe software for clinical note transcription with the patient, who gave verbal consent to proceed.  History of Present Illness   Jeff Alexander is a 63 year old male who presents with upper respiratory symptoms prior to upcoming surgery.  He has been experiencing upper respiratory symptoms, including sniffles, rhinorrhea, and nasal congestion. These symptoms have significantly improved and are almost resolved. He has been managing these symptoms with DayQuil and NyQuil, taken as needed.  He is scheduled for surgery next Friday. He has not been using Flonase.  He mentions feeling much better overall and is considering attending a small gathering for a friend's birthday, indicating that he is mindful of the infectious period following the onset of his symptoms.         OBJECTIVE:       06/22/2023    9:27 AM  Depression screen PHQ 2/9  Decreased Interest 0  Down, Depressed, Hopeless 0  PHQ - 2 Score 0     BP Readings from Last 3 Encounters:  03/08/24 128/84  03/05/24 (!) 143/85  02/06/24 136/82    BP 128/84   Pulse 81   Wt 148 lb 3.2 oz (67.2 kg)   PF 98 L/min   BMI 23.56 kg/m    Physical Exam          Physical Exam Constitutional:      Appearance: Normal appearance.  Neurological:     General: No focal deficit present.     Mental Status: He is alert and oriented to person, place, and time. Mental status is at baseline.     ASSESSMENT/PLAN:   Assessment & Plan Viral URI    Assessment and Plan     Acute upper respiratory infection Symptoms nearly resolved. - Recommended Flonase for nasal congestion. - Advised against unnecessary medications. - Cleared for social activities.         Jackston Oaxaca A. Vita MD [facility]

## 2024-03-09 ENCOUNTER — Other Ambulatory Visit: Payer: Self-pay | Admitting: Family Medicine

## 2024-03-09 ENCOUNTER — Encounter (INDEPENDENT_AMBULATORY_CARE_PROVIDER_SITE_OTHER): Payer: Self-pay

## 2024-03-11 ENCOUNTER — Other Ambulatory Visit (HOSPITAL_COMMUNITY): Payer: Self-pay

## 2024-03-11 ENCOUNTER — Other Ambulatory Visit: Payer: Self-pay

## 2024-03-11 MED ORDER — PEN NEEDLES 31G X 5 MM MISC
1 refills | Status: AC
  Filled 2024-03-11: qty 100, 90d supply, fill #0

## 2024-03-12 ENCOUNTER — Other Ambulatory Visit (HOSPITAL_COMMUNITY): Payer: Self-pay

## 2024-03-14 ENCOUNTER — Encounter (HOSPITAL_COMMUNITY): Payer: Self-pay | Admitting: Urology

## 2024-03-14 NOTE — Anesthesia Preprocedure Evaluation (Signed)
 Anesthesia Evaluation    Airway       Comment: Previous grade I view with MAC 3, easy mask Dental   Pulmonary Recent URI  (seen 03/08/2024)          Cardiovascular hypertension (lisinopril ), Pt. on medications   HLD   Neuro/Psych  Headaches PSYCHIATRIC DISORDERS Anxiety        GI/Hepatic   Endo/Other  diabetes, Type 2, Insulin  Dependent    Renal/GU Renal disease (right renal mass)   H/o prostate cancer s/p prostatectomy    Musculoskeletal  (+) Arthritis ,  Osteoporosis    Abdominal   Peds  Hematology Lab Results      Component                Value               Date                      WBC                      12.0 (H)            03/05/2024                HGB                      12.8 (L)            03/05/2024                HCT                      37.5 (L)            03/05/2024                MCV                      90.8                03/05/2024                PLT                      327                 03/05/2024              Anesthesia Other Findings   Reproductive/Obstetrics                              Anesthesia Physical Anesthesia Plan  ASA: 3  Anesthesia Plan: General   Post-op Pain Management: Tylenol  PO (pre-op)* and Dilaudid  IV   Induction: Intravenous  PONV Risk Score and Plan: 2 and Ondansetron , Dexamethasone , Midazolam  and Treatment may vary due to age or medical condition  Airway Management Planned: Oral ETT  Additional Equipment:   Intra-op Plan:   Post-operative Plan: Extubation in OR  Informed Consent:   Plan Discussed with:   Anesthesia Plan Comments:         Anesthesia Quick Evaluation

## 2024-03-15 ENCOUNTER — Encounter (HOSPITAL_COMMUNITY): Payer: Self-pay | Admitting: Urology

## 2024-03-15 ENCOUNTER — Observation Stay (HOSPITAL_COMMUNITY)
Admission: RE | Admit: 2024-03-15 | Discharge: 2024-03-16 | Disposition: A | Discharge status: Home / Self Care | Attending: Urology | Admitting: Urology

## 2024-03-15 ENCOUNTER — Encounter (HOSPITAL_COMMUNITY): Admission: RE | Disposition: A | Payer: Self-pay | Source: Home / Self Care | Attending: Urology

## 2024-03-15 ENCOUNTER — Other Ambulatory Visit (HOSPITAL_COMMUNITY): Payer: Self-pay

## 2024-03-15 ENCOUNTER — Other Ambulatory Visit: Payer: Self-pay

## 2024-03-15 ENCOUNTER — Ambulatory Visit (HOSPITAL_BASED_OUTPATIENT_CLINIC_OR_DEPARTMENT_OTHER): Payer: Self-pay

## 2024-03-15 ENCOUNTER — Encounter (HOSPITAL_COMMUNITY): Payer: Self-pay | Admitting: Medical

## 2024-03-15 DIAGNOSIS — Z01818 Encounter for other preprocedural examination: Secondary | ICD-10-CM

## 2024-03-15 DIAGNOSIS — E119 Type 2 diabetes mellitus without complications: Secondary | ICD-10-CM | POA: Insufficient documentation

## 2024-03-15 DIAGNOSIS — Z794 Long term (current) use of insulin: Secondary | ICD-10-CM

## 2024-03-15 DIAGNOSIS — Z79899 Other long term (current) drug therapy: Secondary | ICD-10-CM | POA: Insufficient documentation

## 2024-03-15 DIAGNOSIS — N2889 Other specified disorders of kidney and ureter: Secondary | ICD-10-CM

## 2024-03-15 DIAGNOSIS — C641 Malignant neoplasm of right kidney, except renal pelvis: Principal | ICD-10-CM | POA: Insufficient documentation

## 2024-03-15 DIAGNOSIS — I10 Essential (primary) hypertension: Secondary | ICD-10-CM | POA: Insufficient documentation

## 2024-03-15 HISTORY — PX: ROBOT ASSISTED LAPAROSCOPIC NEPHRECTOMY: SHX5140

## 2024-03-15 LAB — TYPE AND SCREEN
ABO/RH(D): O POS
Antibody Screen: NEGATIVE

## 2024-03-15 LAB — BASIC METABOLIC PANEL WITH GFR
Anion gap: 9 (ref 5–15)
BUN: 11 mg/dL (ref 8–23)
CO2: 24 mmol/L (ref 22–32)
Calcium: 9.1 mg/dL (ref 8.9–10.3)
Chloride: 104 mmol/L (ref 98–111)
Creatinine, Ser: 0.99 mg/dL (ref 0.61–1.24)
GFR, Estimated: >60 mL/min
Glucose, Bld: 167 mg/dL — ABNORMAL HIGH (ref 70–99)
Potassium: 4.2 mmol/L (ref 3.5–5.1)
Sodium: 137 mmol/L (ref 135–145)

## 2024-03-15 LAB — HIV ANTIBODY (ROUTINE TESTING W REFLEX): HIV Screen 4th Generation wRfx: NONREACTIVE

## 2024-03-15 LAB — HEMOGLOBIN AND HEMATOCRIT, BLOOD
HCT: 33.1 % — ABNORMAL LOW (ref 39.0–52.0)
Hemoglobin: 11.6 g/dL — ABNORMAL LOW (ref 13.0–17.0)

## 2024-03-15 SURGERY — NEPHRECTOMY, RADICAL, ROBOT-ASSISTED, LAPAROSCOPIC, ADULT
Anesthesia: General | Site: Abdomen | Laterality: Right

## 2024-03-15 MED ORDER — ORAL CARE MOUTH RINSE: 15.0000 mL | Freq: Once | OROMUCOSAL | Status: AC

## 2024-03-15 MED ORDER — LACTATED RINGERS IR SOLN
Status: DC | PRN
  Administered 2024-03-15: 1000 mL

## 2024-03-15 MED ORDER — ACETAMINOPHEN 10 MG/ML IV SOLN
1000.0000 mg | Freq: Once | INTRAVENOUS | Status: DC | PRN
  Administered 2024-03-15: 1000 mg via INTRAVENOUS

## 2024-03-15 MED ORDER — ACETAMINOPHEN 500 MG PO TABS
1000.0000 mg | ORAL_TABLET | Freq: Once | ORAL | Status: AC
  Administered 2024-03-15: 1000 mg via ORAL
  Filled 2024-03-15: qty 2

## 2024-03-15 MED ORDER — TRAMADOL HCL 50 MG PO TABS
50.0000 mg | ORAL_TABLET | Freq: Four times a day (QID) | ORAL | 0 refills | Status: AC | PRN
  Filled 2024-03-15: qty 15, 2d supply, fill #0

## 2024-03-15 MED ORDER — OXYCODONE HCL 5 MG/5ML PO SOLN
5.0000 mg | Freq: Once | ORAL | Status: AC | PRN
  Administered 2024-03-15: 5 mg via ORAL

## 2024-03-15 MED ORDER — LIDOCAINE HCL (PF) 2 % IJ SOLN
INTRAMUSCULAR | Status: AC
  Filled 2024-03-15: qty 5

## 2024-03-15 MED ORDER — TRAMADOL HCL 50 MG PO TABS: 50.0000 mg | ORAL_TABLET | Freq: Four times a day (QID) | ORAL | Status: DC | PRN

## 2024-03-15 MED ORDER — FLUTICASONE PROPIONATE 50 MCG/ACT NA SUSP
2.0000 | Freq: Every day | NASAL | Status: DC
  Filled 2024-03-15: qty 16

## 2024-03-15 MED ORDER — SODIUM CHLORIDE (PF) 0.9 % IJ SOLN
INTRAMUSCULAR | Status: DC | PRN
  Administered 2024-03-15: 57 mL

## 2024-03-15 MED ORDER — LACTATED RINGERS IV SOLN: INTRAVENOUS | Status: DC | PRN

## 2024-03-15 MED ORDER — LORATADINE 10 MG PO TABS
10.0000 mg | ORAL_TABLET | Freq: Every day | ORAL | Status: DC
  Administered 2024-03-16: 10 mg via ORAL
  Filled 2024-03-15 (×2): qty 1

## 2024-03-15 MED ORDER — HYDROMORPHONE HCL 1 MG/ML IJ SOLN
0.2500 mg | INTRAMUSCULAR | Status: DC | PRN
  Administered 2024-03-15 (×4): 0.5 mg via INTRAVENOUS

## 2024-03-15 MED ORDER — SUGAMMADEX SODIUM 200 MG/2ML IV SOLN
INTRAVENOUS | Status: DC | PRN
  Administered 2024-03-15: 200 mg via INTRAVENOUS

## 2024-03-15 MED ORDER — POLYETHYLENE GLYCOL 3350 17 GM/SCOOP PO POWD: 238.0000 g | Freq: Once | ORAL | Status: DC

## 2024-03-15 MED ORDER — PHENYLEPHRINE HCL (PRESSORS) 10 MG/ML IV SOLN
INTRAVENOUS | Status: DC | PRN
  Administered 2024-03-15: 160 ug via INTRAVENOUS
  Administered 2024-03-15: 80 ug via INTRAVENOUS

## 2024-03-15 MED ORDER — HYDROMORPHONE HCL 1 MG/ML IJ SOLN
INTRAMUSCULAR | Status: AC
  Filled 2024-03-15: qty 1

## 2024-03-15 MED ORDER — PROPOFOL 10 MG/ML IV BOLUS
INTRAVENOUS | Status: AC
  Filled 2024-03-15: qty 20

## 2024-03-15 MED ORDER — ACETAMINOPHEN 10 MG/ML IV SOLN
1000.0000 mg | Freq: Four times a day (QID) | INTRAVENOUS | Status: DC
  Administered 2024-03-15 – 2024-03-16 (×3): 1000 mg via INTRAVENOUS
  Filled 2024-03-15 (×4): qty 100

## 2024-03-15 MED ORDER — ROCURONIUM BROMIDE 100 MG/10ML IV SOLN
INTRAVENOUS | Status: DC | PRN
  Administered 2024-03-15: 30 mg via INTRAVENOUS
  Administered 2024-03-15: 50 mg via INTRAVENOUS
  Administered 2024-03-15: 20 mg via INTRAVENOUS

## 2024-03-15 MED ORDER — CEFAZOLIN SODIUM-DEXTROSE 1-4 GM/50ML-% IV SOLN
1.0000 g | Freq: Three times a day (TID) | INTRAVENOUS | Status: AC
  Administered 2024-03-15 – 2024-03-16 (×3): 1 g via INTRAVENOUS
  Filled 2024-03-15 (×3): qty 50

## 2024-03-15 MED ORDER — HYDROMORPHONE HCL 1 MG/ML IJ SOLN
1.0000 mg | Freq: Once | INTRAMUSCULAR | Status: AC
  Administered 2024-03-15: 1 mg via INTRAVENOUS

## 2024-03-15 MED ORDER — ALBUMIN HUMAN 5 % IV SOLN: INTRAVENOUS | Status: DC | PRN

## 2024-03-15 MED ORDER — MIDAZOLAM HCL 5 MG/5ML IJ SOLN
INTRAMUSCULAR | Status: DC | PRN
  Administered 2024-03-15: 2 mg via INTRAVENOUS

## 2024-03-15 MED ORDER — POLYETHYLENE GLYCOL 3350 17 G PO PACK: 17.0000 g | PACK | Freq: Every day | ORAL | Status: DC | PRN

## 2024-03-15 MED ORDER — EPHEDRINE SULFATE (PRESSORS) 25 MG/5ML IV SOSY
PREFILLED_SYRINGE | INTRAVENOUS | Status: DC | PRN
  Administered 2024-03-15: 10 mg via INTRAVENOUS

## 2024-03-15 MED ORDER — DEXAMETHASONE SOD PHOSPHATE PF 10 MG/ML IJ SOLN
INTRAMUSCULAR | Status: DC | PRN
  Administered 2024-03-15: 4 mg via INTRAVENOUS

## 2024-03-15 MED ORDER — FENTANYL CITRATE (PF) 100 MCG/2ML IJ SOLN
INTRAMUSCULAR | Status: AC
  Filled 2024-03-15: qty 2

## 2024-03-15 MED ORDER — HYDROMORPHONE HCL 1 MG/ML IJ SOLN
INTRAMUSCULAR | Status: AC
  Filled 2024-03-15: qty 2

## 2024-03-15 MED ORDER — ACETAMINOPHEN 10 MG/ML IV SOLN
INTRAVENOUS | Status: AC
  Filled 2024-03-15: qty 100

## 2024-03-15 MED ORDER — LIDOCAINE HCL (CARDIAC) PF 100 MG/5ML IV SOSY
PREFILLED_SYRINGE | INTRAVENOUS | Status: DC | PRN
  Administered 2024-03-15: 50 mg via INTRAVENOUS

## 2024-03-15 MED ORDER — HYDROMORPHONE HCL 1 MG/ML IJ SOLN
0.5000 mg | INTRAMUSCULAR | Status: DC | PRN
  Administered 2024-03-15 (×2): 0.5 mg via INTRAVENOUS

## 2024-03-15 MED ORDER — CEFAZOLIN SODIUM-DEXTROSE 2-4 GM/100ML-% IV SOLN
2.0000 g | INTRAVENOUS | Status: AC
  Administered 2024-03-15: 2 g via INTRAVENOUS
  Filled 2024-03-15: qty 100

## 2024-03-15 MED ORDER — SODIUM CHLORIDE (PF) 0.9 % IJ SOLN
INTRAMUSCULAR | Status: AC
  Filled 2024-03-15: qty 20

## 2024-03-15 MED ORDER — HYDROMORPHONE HCL 1 MG/ML IJ SOLN
0.5000 mg | INTRAMUSCULAR | Status: DC | PRN
  Administered 2024-03-16: 1 mg via INTRAVENOUS
  Filled 2024-03-15: qty 1

## 2024-03-15 MED ORDER — VENLAFAXINE HCL 50 MG PO TABS
50.0000 mg | ORAL_TABLET | Freq: Every day | ORAL | Status: DC
  Administered 2024-03-15: 50 mg via ORAL
  Filled 2024-03-15: qty 1

## 2024-03-15 MED ORDER — SODIUM CHLORIDE 0.9 % IV SOLN
12.5000 mg | Freq: Once | INTRAVENOUS | Status: AC
  Administered 2024-03-15: 12.5 mg via INTRAVENOUS
  Filled 2024-03-15: qty 12.5

## 2024-03-15 MED ORDER — ONDANSETRON HCL 4 MG/2ML IJ SOLN
INTRAMUSCULAR | Status: DC | PRN
  Administered 2024-03-15: 4 mg via INTRAVENOUS

## 2024-03-15 MED ORDER — CHLORHEXIDINE GLUCONATE 0.12 % MT SOLN
15.0000 mL | Freq: Once | OROMUCOSAL | Status: AC
  Administered 2024-03-15: 15 mL via OROMUCOSAL

## 2024-03-15 MED ORDER — LACTATED RINGERS IV SOLN: INTRAVENOUS | Status: DC

## 2024-03-15 MED ORDER — SCOPOLAMINE 1 MG/3DAYS TD PT72
1.0000 | MEDICATED_PATCH | TRANSDERMAL | Status: DC
  Administered 2024-03-15: 1 mg via TRANSDERMAL
  Filled 2024-03-15: qty 1

## 2024-03-15 MED ORDER — MIDAZOLAM HCL 2 MG/2ML IJ SOLN
INTRAMUSCULAR | Status: AC
  Filled 2024-03-15: qty 2

## 2024-03-15 MED ORDER — BUPIVACAINE-EPINEPHRINE 0.5% -1:200000 IJ SOLN
INTRAMUSCULAR | Status: DC | PRN
  Administered 2024-03-15: 13 mL

## 2024-03-15 MED ORDER — SODIUM CHLORIDE 0.9 % IV SOLN: INTRAVENOUS | Status: DC | PRN

## 2024-03-15 MED ORDER — BUPIVACAINE-EPINEPHRINE (PF) 0.5% -1:200000 IJ SOLN
INTRAMUSCULAR | Status: AC
  Filled 2024-03-15: qty 30

## 2024-03-15 MED ORDER — DOCUSATE SODIUM 100 MG PO CAPS
100.0000 mg | ORAL_CAPSULE | Freq: Two times a day (BID) | ORAL | Status: DC
  Administered 2024-03-15 – 2024-03-16 (×2): 100 mg via ORAL
  Filled 2024-03-15 (×2): qty 1

## 2024-03-15 MED ORDER — OXYCODONE HCL 5 MG/5ML PO SOLN
ORAL | Status: AC
  Filled 2024-03-15: qty 5

## 2024-03-15 MED ORDER — FENTANYL CITRATE (PF) 100 MCG/2ML IJ SOLN
INTRAMUSCULAR | Status: DC | PRN
  Administered 2024-03-15: 50 ug via INTRAVENOUS
  Administered 2024-03-15: 100 ug via INTRAVENOUS

## 2024-03-15 MED ORDER — LACTATED RINGERS IV SOLN: INTRAVENOUS | Status: AC

## 2024-03-15 MED ORDER — ONDANSETRON HCL 4 MG/2ML IJ SOLN
INTRAMUSCULAR | Status: AC
  Filled 2024-03-15: qty 2

## 2024-03-15 MED ORDER — ONDANSETRON HCL 4 MG/2ML IJ SOLN
4.0000 mg | INTRAMUSCULAR | Status: DC | PRN
  Administered 2024-03-15: 4 mg via INTRAVENOUS
  Filled 2024-03-15: qty 2

## 2024-03-15 MED ORDER — HEMOSTATIC AGENTS (NO CHARGE) OPTIME
TOPICAL | Status: DC | PRN
  Administered 2024-03-15: 1 via TOPICAL

## 2024-03-15 MED ORDER — OXYCODONE HCL 5 MG PO TABS: 5.0000 mg | ORAL_TABLET | Freq: Once | ORAL | Status: AC | PRN

## 2024-03-15 MED ORDER — BUPIVACAINE LIPOSOME 1.3 % IJ SUSP
INTRAMUSCULAR | Status: AC
  Filled 2024-03-15: qty 20

## 2024-03-15 MED ORDER — PROPOFOL 10 MG/ML IV BOLUS
INTRAVENOUS | Status: DC | PRN
  Administered 2024-03-15: 200 mg via INTRAVENOUS

## 2024-03-15 MED ORDER — PROMETHAZINE (PHENERGAN) 6.25MG IN NS 50ML IVPB
6.2500 mg | Freq: Four times a day (QID) | INTRAVENOUS | Status: DC | PRN
  Filled 2024-03-15: qty 50

## 2024-03-15 MED ORDER — AMISULPRIDE (ANTIEMETIC) 5 MG/2ML IV SOLN: 10.0000 mg | Freq: Once | INTRAVENOUS | Status: DC | PRN

## 2024-03-15 SURGICAL SUPPLY — 51 items
APPLICATOR SURGIFLO ENDO (HEMOSTASIS) IMPLANT
BAG COUNTER SPONGE SURGICOUNT (BAG) IMPLANT
BAG LAPAROSCOPIC 12 15 PORT 16 (BASKET) ×1 IMPLANT
CHLORAPREP W/TINT 26 (MISCELLANEOUS) ×1 IMPLANT
CLIP LIGATING HEM O LOK PURPLE (MISCELLANEOUS) ×1 IMPLANT
CLIP LIGATING HEMO O LOK GREEN (MISCELLANEOUS) ×1 IMPLANT
COVER SURGICAL LIGHT HANDLE (MISCELLANEOUS) ×1 IMPLANT
COVER TIP SHEARS 8 DVNC (MISCELLANEOUS) ×1 IMPLANT
DERMABOND ADVANCED .7 DNX12 (GAUZE/BANDAGES/DRESSINGS) ×2 IMPLANT
DRAPE ARM DVNC X/XI (DISPOSABLE) ×4 IMPLANT
DRAPE COLUMN DVNC XI (DISPOSABLE) ×1 IMPLANT
DRAPE INCISE IOBAN 66X45 STRL (DRAPES) ×1 IMPLANT
DRAPE LAPAROSCOPIC ABDOMINAL (DRAPES) IMPLANT
DRAPE SHEET LG 3/4 BI-LAMINATE (DRAPES) ×1 IMPLANT
DRIVER NDL LRG 8 DVNC XI (INSTRUMENTS) ×2 IMPLANT
DRIVER NDLE LRG 8 DVNC XI (INSTRUMENTS) ×2 IMPLANT
ELECT PENCIL ROCKER SW 15FT (MISCELLANEOUS) IMPLANT
ELECT REM PT RETURN 15FT ADLT (MISCELLANEOUS) ×1 IMPLANT
FORCEPS BPLR LNG DVNC XI (INSTRUMENTS) ×1 IMPLANT
FORCEPS PROGRASP DVNC XI (FORCEP) ×1 IMPLANT
GLOVE BIO SURGEON STRL SZ 6.5 (GLOVE) ×1 IMPLANT
GLOVE SURG LX STRL 7.5 STRW (GLOVE) ×2 IMPLANT
GOWN SRG XL LVL 4 BRTHBL STRL (GOWNS) ×1 IMPLANT
GOWN STRL REUS W/ TWL LRG LVL3 (GOWN DISPOSABLE) ×2 IMPLANT
HEMOSTAT SURGICEL 4X8 (HEMOSTASIS) IMPLANT
HOLDER FOLEY CATH W/STRAP (MISCELLANEOUS) ×1 IMPLANT
IRRIGATION SUCT STRKRFLW 2 WTP (MISCELLANEOUS) IMPLANT
KIT BASIN OR (CUSTOM PROCEDURE TRAY) ×1 IMPLANT
KIT TURNOVER KIT A (KITS) ×1 IMPLANT
PAD POSITIONING PINK XL (MISCELLANEOUS) ×1 IMPLANT
PROTECTOR NERVE ULNAR (MISCELLANEOUS) ×2 IMPLANT
RELOAD STAPLE 45 2.6 WHT THIN (STAPLE) IMPLANT
SCISSORS LAP 5X35 DISP (ENDOMECHANICALS) IMPLANT
SCISSORS MNPLR CVD DVNC XI (INSTRUMENTS) ×1 IMPLANT
SEAL UNIV 5-12 XI (MISCELLANEOUS) ×3 IMPLANT
SET TUBE SMOKE EVAC HIGH FLOW (TUBING) ×1 IMPLANT
SOLUTION ELECTROSURG ANTI STCK (MISCELLANEOUS) ×1 IMPLANT
SPIKE FLUID TRANSFER (MISCELLANEOUS) ×1 IMPLANT
STAPLER POWER ECHELON 45 WIDE (STAPLE) IMPLANT
SURGIFLO W/THROMBIN 8M KIT (HEMOSTASIS) IMPLANT
SUT MNCRL AB 4-0 PS2 18 (SUTURE) ×2 IMPLANT
SUT PDS AB 1 CT1 27 (SUTURE) ×2 IMPLANT
SUT VIC AB 0 CT1 27XBRD ANTBC (SUTURE) ×1 IMPLANT
SUT VIC AB 2-0 SH 27X BRD (SUTURE) IMPLANT
SUT VICRYL 0 UR6 27IN ABS (SUTURE) ×1 IMPLANT
TOWEL OR DSP ST BLU DLX 10/PK (DISPOSABLE) ×2 IMPLANT
TRAY FOLEY MTR SLVR 16FR STAT (SET/KITS/TRAYS/PACK) ×1 IMPLANT
TRAY LAPAROSCOPIC (CUSTOM PROCEDURE TRAY) ×1 IMPLANT
TROCAR XCEL NON-BLD 5MMX100MML (ENDOMECHANICALS) IMPLANT
TROCAR Z THREAD OPTICAL 12X100 (TROCAR) ×1 IMPLANT
WATER STERILE IRR 1000ML POUR (IV SOLUTION) ×1 IMPLANT

## 2024-03-15 NOTE — Anesthesia Postprocedure Evaluation (Signed)
"   Anesthesia Post Note  Patient: Jeff Alexander  Procedure(s) Performed: NEPHRECTOMY, RADICAL, ROBOT-ASSISTED, LAPAROSCOPIC, ADULT (Right: Abdomen)     Patient location during evaluation: PACU Anesthesia Type: General Level of consciousness: awake Pain management: pain level controlled Vital Signs Assessment: post-procedure vital signs reviewed and stable Respiratory status: spontaneous breathing, nonlabored ventilation and respiratory function stable Cardiovascular status: blood pressure returned to baseline and stable Postop Assessment: no apparent nausea or vomiting Anesthetic complications: no   No notable events documented.  Last Vitals:  Vitals:   03/15/24 1345 03/15/24 1400  BP: 122/77 109/74  Pulse: 82 87  Resp: (!) 9 13  Temp:  (!) 36.4 C  SpO2: 95% 95%    Last Pain:  Vitals:   03/15/24 1400  TempSrc:   PainSc: 0-No pain                 [name]      "

## 2024-03-15 NOTE — Interval H&P Note (Signed)
 History and Physical Interval Note:  03/15/2024 6:55 AM  Jeff Alexander  has presented today for surgery, with the diagnosis of RIGHT RENAL MASS.  The various methods of treatment have been discussed with the patient and family. After consideration of risks, benefits and other options for treatment, the patient has consented to  Procedures with comments: NEPHRECTOMY, RADICAL, ROBOT-ASSISTED, LAPAROSCOPIC, ADULT (Right) - RIGHT ROBOTIC LAPAROSCOPIC RADICAL NEPHRECTOMY as a surgical intervention.  The patient's history has been reviewed, patient examined, no change in status, stable for surgery.  I have reviewed the patient's chart and labs.  Questions were answered to the patient's satisfaction.     [name]

## 2024-03-15 NOTE — Op Note (Addendum)
 Operative Note  Preoperative diagnosis:  1.  Right renal mass  Postoperative diagnosis: 1.  Same  Procedure(s): 1.  Right robotic radical nephrectomy  Surgeon: Morene Salines, MD  Assistants:  Maurilio Agar, MD   Anesthesia:  General  Complications:  None  EBL:  25 cc   Specimens: 1. Right kidney  Drains/Catheters: 1.  Foley catheter   Intraoperative findings:   Single renal artery and vein Adrenal gland spared and healthy-appearing at the conclusion of case Kidney removed en bloc Small cold injury to posterior liver from robotic scissors--this was tamponaded during case with the laparoscopic liver retractor and inspected at the conclusion of case. The injury was hemostatic, so additional cautery with Argon laser was not performed.   Indication:  Jeff Alexander is a 63 y.o. male with metastatic HS prostate cancer and a large right renal mass.  Description of procedure:  After informed consent was signed, the patient was taken back to the operating room and properly anesthetized.  The patient was then placed in the left lateral decubitus position with all pressure points padded.  The abdomen was then prepped and draped in the usual sterile fashion.  A time-out was then performed.     An 8 mm incision was then made lateral to the right rectus muscle at the level of the right 12th rib.  Using hassan technique a 8 mm robotic trocar was placed. The abdominal cavity was then insufflated to 15 mmHg.  The robotic camera was then inserted through the port and inspection of the abdominal cavity revealed no evidence of adjacent organ or vessel injury. We then placed three additional 8 mm robotic ports, and a 12 mm assistant port in such as fashion as to triangulate the right renal hilum. Adhesions were taken down from the abdominal wall using laparoscopic scissors.  The robot was then docked into postion.   Using a combination of blunt and cold scissors dissection, the hepatic  attachments were released from the abdominal sidewall. At this point, it was established that a liver retractor would be necessary for maximum exposure. A 5 mm subxiphoid port was placed under direct vision.  While retracting the liver with the monopolar scissors, a small rent was made in the posterior surface of the liver. A locking grasper was then inserted through the 5 mm sub-xyphoid port and used to retract the posterior surface of the liver more cephalad while simultaneously tamponading the liver injury.  The white line of Toldt along the ascending colon was then incised, allowing us  to reflect the colon medially and expose the anterior surface of the right kidney.  The duodenum was then Kocherized medially, which abruptly led us  to the identification of the inferior vena cava.    Once the colon was adequately mobilized, we moved to the lower pole and identified the gonadal vein and ureter.  The gonadal vein was then left running parallel to the vena cava and the right ureter was reflected anteriorly.  Using cautious cautery, the overlying perihilar attachments were then released.  This yielded visualization of the renal hilum, which included a single right renal vein and a single right renal artery.  The perilymphatic tissue surrounding the right renal artery were carefully released so that the right renal artery was fully encircled.    A 45 mm powered endovascular stapler was then used to ligate the right renal artery, followed by the right renal vein.  The right hilar stump was hemostatic following staple ligation.  Hemoclips were then  applied to the proximal aspects of the right ureter, which was then incised.  The remaining perinephric attachments were then incised using electrocautery. Reinspection of the right retroperitoneal space revealed excellent hemostasis. Once the right kidney was fully mobile, it was placed in an Endo Catch bag and left in the abdominal cavity. The liver retractor was  removed and the posterior liver was inspected. There was a small hematoma, but the liver was otherwise hemostatic.    The 12 mm assistant port incision was extended superiorly and inferiorly and the right kidney was removed within the Endo Catch bag.  The fascia of the external and internal oblique were then closed with a running 0 PDS suture. The camera port fascia was then closed with a 0 Vicryl suture. The skin incisions were then closed using 4-0 Monocryl.  Dermabond was applied to all skin incisions.  The patient was then awoken from anesthesia having tolerated the procedure well.  They were transferred to the postanesthesia in stable condition.  Plan: - CLD - Foley out at 0500  - [facility]

## 2024-03-15 NOTE — Anesthesia Procedure Notes (Addendum)
 Procedure Name: Intubation Date/Time: 03/15/2024 7:30 AM  Performed by: Vincenzo Show, CRNAPre-anesthesia Checklist: Patient identified, Emergency Drugs available, Suction available, Patient being monitored and Timeout performed Patient Re-evaluated:Patient Re-evaluated prior to induction Oxygen Delivery Method: Circle system utilized Preoxygenation: Pre-oxygenation with 100% oxygen Induction Type: IV induction Ventilation: Oral airway inserted - appropriate to patient size and Two handed mask ventilation required Laryngoscope Size: Mac and 4 Grade View: Grade I Tube type: Oral Tube size: 7.5 mm Number of attempts: 1 Airway Equipment and Method: Stylet Placement Confirmation: ETT inserted through vocal cords under direct vision, positive ETCO2 and breath sounds checked- equal and bilateral Secured at: 21 cm Tube secured with: Tape Dental Injury: Teeth and Oropharynx as per pre-operative assessment  Comments: ETT inserted by Madelaine KET with direct supervision from [facility], CRNA and Dr. Dasie.

## 2024-03-15 NOTE — Discharge Instructions (Signed)
  Driving:  It is against the law to drive when taking narcotic pain medications.  You should wait at least 8 hours after taking your last pain pill before driving.  Further, you should not drive if you are to sore to react quickly or if you have something impeding your ability to drive.   Activity:  You are encouraged to ambulate frequently (about every hour during waking hours) to help prevent blood clots from forming in your legs or lungs.  However, you should not engage in any heavy lifting (> 10-15 lbs), strenuous activity, or straining.  Diet: You should advance your diet as instructed by your physician.  It will be normal to have some bloating, nausea, and abdominal discomfort intermittently.  Prescriptions:  You will be provided a prescription for pain medication to take as needed.  If your pain is not severe enough to require the prescription pain medication, you may take extra strength Tylenol  instead which will have less side effects.  You should also take a prescribed stool softener to avoid straining with bowel movements as the prescription pain medication may constipate you.  Incisions: You may remove your dressing bandages 48 hours after surgery if not removed in the hospital.  You will either have some small staples or special tissue glue at each of the incision sites. Once the bandages are removed (if present), the incisions may stay open to air.  You may start showering (but not soaking or bathing in water) the 2nd day after surgery and the incisions simply need to be patted dry after the shower.  No additional care is needed.  What to call us  about: You should call the office [phone]) if you develop fever > 101 or develop persistent vomiting. Activity:  You are encouraged to ambulate frequently (about every hour during waking hours) to help prevent blood clots from forming in your legs or lungs.  However, you should not engage in any heavy lifting (> 10-15 lbs), strenuous activity,  or straining.

## 2024-03-15 NOTE — Transfer of Care (Signed)
 Immediate Anesthesia Transfer of Care Note  Patient: Jeff Alexander  Procedure(s) Performed: NEPHRECTOMY, RADICAL, ROBOT-ASSISTED, LAPAROSCOPIC, ADULT (Right: Abdomen)  Patient Location: PACU  Anesthesia Type:General  Level of Consciousness: awake and oriented  Airway & Oxygen Therapy: Patient Spontanous Breathing and Patient connected to face mask oxygen  Post-op Assessment: Report given to RN and Post -op Vital signs reviewed and stable  Post vital signs: Reviewed and stable  Last Vitals:  Vitals Value Taken Time  BP 160/87 03/15/24 10:34  Temp    Pulse 80 03/15/24 10:41  Resp 16 03/15/24 10:41  SpO2 100 % 03/15/24 10:41  Vitals shown include unfiled device data.  Last Pain:  Vitals:   03/15/24 0629  TempSrc:   PainSc: 0-No pain      Patients Stated Pain Goal: 4 (03/15/24 9374)  Complications: No notable events documented.

## 2024-03-16 ENCOUNTER — Encounter (HOSPITAL_COMMUNITY): Payer: Self-pay | Admitting: Urology

## 2024-03-16 ENCOUNTER — Other Ambulatory Visit (HOSPITAL_COMMUNITY): Payer: Self-pay

## 2024-03-16 DIAGNOSIS — C641 Malignant neoplasm of right kidney, except renal pelvis: Secondary | ICD-10-CM | POA: Diagnosis not present

## 2024-03-16 LAB — BASIC METABOLIC PANEL WITH GFR
Anion gap: 8 (ref 5–15)
BUN: 10 mg/dL (ref 8–23)
CO2: 26 mmol/L (ref 22–32)
Calcium: 9.1 mg/dL (ref 8.9–10.3)
Chloride: 103 mmol/L (ref 98–111)
Creatinine, Ser: 1.13 mg/dL (ref 0.61–1.24)
GFR, Estimated: >60 mL/min
Glucose, Bld: 116 mg/dL — ABNORMAL HIGH (ref 70–99)
Potassium: 4 mmol/L (ref 3.5–5.1)
Sodium: 136 mmol/L (ref 135–145)

## 2024-03-16 LAB — HEMOGLOBIN AND HEMATOCRIT, BLOOD
HCT: 30.7 % — ABNORMAL LOW (ref 39.0–52.0)
Hemoglobin: 10.7 g/dL — ABNORMAL LOW (ref 13.0–17.0)

## 2024-03-16 NOTE — Progress Notes (Signed)
 Discharge meds in a secure bag delivered to patient by this RN

## 2024-03-16 NOTE — Discharge Summary (Addendum)
 Date of admission: 03/15/2024  Date of discharge: 03/16/2024  Admission diagnosis: Right renal mass  Discharge diagnosis: Right renal mass   Secondary diagnoses:  Patient Active Problem List   Diagnosis Date Noted   Renal mass, right 03/15/2024   Prediabetes 06/16/2021   Stress incontinence, male 06/14/2021   Hyperlipidemia 06/14/2021   Osteoporosis due to androgen therapy 01/04/2021   History of herpes labialis 06/11/2019   History of shingles 06/11/2019   Allergic rhinitis 06/11/2019   Prostate cancer (HCC) 01/12/2018    Procedures performed: Procedures: NEPHRECTOMY, RADICAL, ROBOT-ASSISTED, LAPAROSCOPIC, ADULT  History and Physical: For full details, please see admission history and physical. Briefly, Jeff Alexander is a 63 y.o. year old patient with HS metastatic prostate cancer doing well on ADT and enzalutamide and a right renal mass scheduled for right radical nephrectomy with Dr. Cam on 03/15/24.   Hospital Course: Patient tolerated the procedure well.  He was then transferred to the floor after an uneventful PACU stay.  His hospital course was uncomplicated.  On POD#1 he had met discharge criteria: was eating a regular diet, was up and ambulating independently,  pain was well controlled, was voiding without a catheter, and was ready to for discharge.   NAD Vitals:   03/15/24 1445 03/15/24 1835 03/15/24 2111 03/16/24 0415  BP: 138/81 (!) 141/80 (!) 144/86 (!) 147/87  Pulse: 73 72 77 75  Resp: 16 17 19 17   Temp: 97.8 F (36.6 C) 97.9 F (36.6 C) 98 F (36.7 C) 98.1 F (36.7 C)  TempSrc: Oral Oral  Oral  SpO2: 97% 100% 100% 100%  Weight: 75.5 kg     Height: 5' 6 (1.676 m)        Intake/Output Summary (Last 24 hours) at 03/16/2024 1014 Last data filed at 03/16/2024 0849 Gross per 24 hour  Intake 763.5 ml  Output 830 ml  Net -66.5 ml    Non-labored breathing Abdomen appropriately tender Incision c/d/I Extremity symmetric Fley removed     Laboratory values:  Recent Labs    03/15/24 1236 03/16/24 0429  HGB 11.6* 10.7*  HCT 33.1* 30.7*   Recent Labs    03/15/24 1236 03/16/24 0429  NA 137 136  K 4.2 4.0  CL 104 103  CO2 24 26  GLUCOSE 167* 116*  BUN 11 10  CREATININE 0.99 1.13  CALCIUM 9.1 9.1   No results for input(s): LABPT, INR in the last 72 hours. No results for input(s): LABURIN in the last 72 hours. Results for orders placed or performed in visit on 04/24/20  Novel Coronavirus, NAA ([facility])     Status: None   Collection Time: 04/24/20  2:00 PM   Specimen: Nasopharyngeal(NP) swabs in vial transport medium  Result Value Ref Range Status   SARS-CoV-2, NAA Not Detected Not Detected Final    Comment: This nucleic acid amplification test was developed and its performance characteristics determined by [employer]. Nucleic acid amplification tests include RT-PCR and TMA. This test has not been FDA cleared or approved. This test has been authorized by FDA under an Emergency Use Authorization (EUA). This test is only authorized for the duration of time the declaration that circumstances exist justifying the authorization of the emergency use of in vitro diagnostic tests for detection of SARS-CoV-2 virus and/or diagnosis of COVID-19 infection under section 564(b)(1) of the Act, 21 U.S.C. 639aaa-6(a) (1), unless the authorization is terminated or revoked sooner. When diagnostic testing is negative, the possibility of a false negative result should be  considered in the context of a patient's recent exposures and the presence of clinical signs and symptoms consistent with COVID-19. An individual without symptoms of COVID-19 and who is not shedding SARS-CoV-2 virus wo uld expect to have a negative (not detected) result in this assay.   SARS-COV-2, NAA 2 DAY TAT     Status: None   Collection Time: 04/24/20  2:00 PM  Result Value Ref Range Status   SARS-CoV-2, NAA 2 DAY TAT Performed  Final     Disposition: Home  Discharge instruction:   Driving:  It is against the law to drive when taking narcotic pain medications.  You should wait at least 8 hours after taking your last pain pill before driving.  Further, you should not drive if you are to sore to react quickly or if you have something impeding your ability to drive.   Activity:  You are encouraged to ambulate frequently (about every hour during waking hours) to help prevent blood clots from forming in your legs or lungs.  However, you should not engage in any heavy lifting (> 10-15 lbs), strenuous activity, or straining.  Diet: You should advance your diet as instructed by your physician.  It will be normal to have some bloating, nausea, and abdominal discomfort intermittently.  Prescriptions:  You will be provided a prescription for pain medication to take as needed.  If your pain is not severe enough to require the prescription pain medication, you may take extra strength Tylenol  instead which will have less side effects.  You should also take a prescribed stool softener to avoid straining with bowel movements as the prescription pain medication may constipate you.  Incisions: You may remove your dressing bandages 48 hours after surgery if not removed in the hospital.  You will either have some small staples or special tissue glue at each of the incision sites. Once the bandages are removed (if present), the incisions may stay open to air.  You may start showering (but not soaking or bathing in water) the 2nd day after surgery and the incisions simply need to be patted dry after the shower.  No additional care is needed.  What to call us  about: You should call the office [phone]) if you develop fever > 101 or develop persistent vomiting. Activity:  You are encouraged to ambulate frequently (about every hour during waking hours) to help prevent blood clots from forming in your legs or lungs.  However, you should not engage in  any heavy lifting (> 10-15 lbs), strenuous activity, or straining.   Discharge medications:  Allergies as of 03/16/2024       Reactions   Codeine Nausea And Vomiting        Medication List     TAKE these medications    acetaminophen  500 MG tablet Commonly known as: TYLENOL  Take 500 mg by mouth every 6 (six) hours as needed for mild pain (pain score 1-3) or moderate pain (pain score 4-6).   ALPRAZolam  0.25 MG tablet Commonly known as: XANAX  TAKE ONE TABLET BY MOUTH TWICE DAILY AS NEEDED FOR ANXIETY   CALCIUM-VITAMIN D PO Take 1 tablet by mouth daily.   Embecta Pen Needle Ultrafine 31G X 5 MM Misc Generic drug: Insulin  Pen Needle Use as instructed. For Forteo  injection   fluticasone  50 MCG/ACT nasal spray Commonly known as: FLONASE  Place 2 sprays into both nostrils daily.   Injection Device Misc by Does not apply route. Eliguard injection   Leuprolide  Acetate (3 Month) 22.5 MG  injection Commonly known as: ELIGARD  Inject 22.5 mg into the skin every 6 (six) months. Last injection given Sept 15, 2025   lisinopril  20 MG tablet Commonly known as: ZESTRIL  Take 1 tablet (20 mg total) by mouth daily.   loratadine  10 MG tablet Commonly known as: CLARITIN  Take 10 mg by mouth daily.   naproxen sodium 220 MG tablet Commonly known as: ALEVE Take 440 mg by mouth daily as needed (pain).   ondansetron  8 MG disintegrating tablet Commonly known as: ZOFRAN -ODT Take 1 tablet (8 mg total) by mouth every 8 (eight) hours as needed for nausea.   REFRESH OP Apply 1 drop to eye daily as needed (dry eyes).   Teriparatide  560 MCG/2.24ML Sopn Inject 20 mcg subcutaneously once daily.   traMADol  50 MG tablet Commonly known as: Ultram  Take 1-2 tablets (50-100 mg total) by mouth every 6 (six) hours as needed for moderate pain (pain score 4-6).   venlafaxine  50 MG tablet Commonly known as: EFFEXOR  Take 1 tablet (50 mg total) by mouth at bedtime.   VITAMIN D PO Take 250 mcg by  mouth once a week.   Xtandi 80 MG tablet Generic drug: enzalutamide Take 160 mg by mouth daily.        Followup:   Follow-up Information     Buck Rodena BIRCH, NP Follow up on 03/27/2024.   Specialty: Urology Why: 9:30am Contact information: [address] [place] [place] [address] [phone]

## 2024-03-18 ENCOUNTER — Other Ambulatory Visit (HOSPITAL_COMMUNITY): Payer: Self-pay

## 2024-03-18 LAB — SURGICAL PATHOLOGY

## 2024-03-29 ENCOUNTER — Other Ambulatory Visit: Payer: Self-pay

## 2024-03-29 ENCOUNTER — Other Ambulatory Visit: Payer: Self-pay | Admitting: Family Medicine

## 2024-03-29 ENCOUNTER — Ambulatory Visit: Payer: Self-pay

## 2024-03-29 MED ORDER — TERIPARATIDE 560 MCG/2.24ML ~~LOC~~ SOPN
20.0000 ug | PEN_INJECTOR | SUBCUTANEOUS | 4 refills | Status: AC
  Filled 2024-04-01: qty 2.24, 28d supply, fill #0
  Filled 2024-04-01: qty 2.24, fill #0
  Filled 2024-05-01: qty 2.24, 28d supply, fill #1

## 2024-03-29 NOTE — Telephone Encounter (Signed)
 Did not schedule as has already seen urology dept and evaluated for his injuries at his post sx appt. He was advised to call PCP just to make him aware.  Reason for Disposition  [1] MODERATE pain (e.g., interferes with normal activities) AND [2] high-risk adult (e.g., age > 60 years, osteoporosis, chronic steroid use)  Answer Assessment - Initial Assessment Questions Pt had right nephrectomy on 03/15/24. Pt reports trip/fall on 03/26/24 and landed on his right side, striking toilet and tile wall. Patient has bruising on upper right side, sternum and below right collarbone. Bruises are the size of a 50 c size. Pt had f/u with urology NP on 03/27/24. They recommended pt see PCP. Taking tylenol  and aleve.   Advised pt to hold aleve until he speaks with urologist as he has a solitary kidney and most recent creatinine was 1.13. Patient stated that he feels like his discomfort is well controlled with tylenol  alone.  Reviewed hydration recommendations with patient with a solitary kidney, closely monitoring his BP and ED precautions including hematuria, increased discomfort, bruising near or around the surgical site or difficulty taking a deep breath. Pt verbalized understanding.  Protocols used: Chest Injury-A-AH  Copied from CRM #[id number]. Topic: Clinical - Red Word Triage >> Mar 29, 2024  8:55 AM Victoria A wrote: [facility] that prompted transfer to Nurse Triage: Patient fell on Monday in restroom-he had urology surgery on 03/15/24 patient fell on side where he had surgery. He has bruises and abrasions near sutures.Patient has pain from bruises and it is tender.

## 2024-04-01 ENCOUNTER — Other Ambulatory Visit: Payer: Self-pay

## 2024-04-01 NOTE — Progress Notes (Signed)
 Specialty Pharmacy Refill Coordination Note  Jeff Alexander is a 64 y.o. male contacted today regarding refills of specialty medication(s) Teriparatide    Patient requested [name] at [facility] at [place] date: 04/12/24   Medication will be filled on: 04/11/24

## 2024-04-02 ENCOUNTER — Other Ambulatory Visit (HOSPITAL_COMMUNITY): Payer: Self-pay

## 2024-04-03 ENCOUNTER — Other Ambulatory Visit (HOSPITAL_COMMUNITY): Payer: Self-pay

## 2024-04-10 ENCOUNTER — Other Ambulatory Visit: Payer: Self-pay

## 2024-05-01 ENCOUNTER — Other Ambulatory Visit (HOSPITAL_COMMUNITY): Payer: Self-pay

## 2024-05-03 ENCOUNTER — Other Ambulatory Visit (HOSPITAL_COMMUNITY): Payer: Self-pay

## 2024-05-03 ENCOUNTER — Other Ambulatory Visit: Payer: Self-pay

## 2024-05-03 NOTE — Progress Notes (Signed)
 Specialty Pharmacy Refill Coordination Note  Spoke with Oluwatomiwa Kinyon is a 64 y.o. male contacted today regarding refills of specialty medication(s) Teriparatide   Doses on hand: 11   Next inj: 05/14/24  Patient requested: [name] at [facility] at [place] date: 05/08/24  Medication will be filled on 05/07/24

## 2024-06-24 ENCOUNTER — Encounter: Payer: Self-pay | Admitting: Family Medicine
